# Patient Record
Sex: Female | Born: 1945 | Race: White | Hispanic: No | Smoking: Never smoker
Health system: Southern US, Community
[De-identification: ages and names within clinical notes are randomized; demographics above are authoritative.]

## PROBLEM LIST (undated history)

## (undated) DIAGNOSIS — K589 Irritable bowel syndrome without diarrhea: Secondary | ICD-10-CM

## (undated) DIAGNOSIS — M1990 Unspecified osteoarthritis, unspecified site: Secondary | ICD-10-CM

## (undated) DIAGNOSIS — F32A Depression, unspecified: Secondary | ICD-10-CM

## (undated) DIAGNOSIS — F419 Anxiety disorder, unspecified: Secondary | ICD-10-CM

## (undated) DIAGNOSIS — F329 Major depressive disorder, single episode, unspecified: Secondary | ICD-10-CM

## (undated) DIAGNOSIS — K219 Gastro-esophageal reflux disease without esophagitis: Secondary | ICD-10-CM

## (undated) HISTORY — DX: Irritable bowel syndrome, unspecified: K58.9

## (undated) HISTORY — PX: APPENDECTOMY: SHX54

## (undated) HISTORY — DX: Gastro-esophageal reflux disease without esophagitis: K21.9

## (undated) HISTORY — PX: RECTOCELE REPAIR: SHX761

## (undated) HISTORY — DX: Unspecified osteoarthritis, unspecified site: M19.90

---

## 1972-03-02 HISTORY — PX: CHOLECYSTECTOMY: SHX55

## 1982-03-02 HISTORY — PX: ABDOMINAL HYSTERECTOMY: SHX81

## 1998-04-26 ENCOUNTER — Ambulatory Visit: Admission: RE | Admit: 1998-04-26 | Discharge: 1998-04-26 | Payer: Self-pay | Admitting: Emergency Medicine

## 1998-11-06 ENCOUNTER — Ambulatory Visit (HOSPITAL_COMMUNITY): Admission: RE | Admit: 1998-11-06 | Discharge: 1998-11-06 | Payer: Self-pay | Admitting: Gastroenterology

## 1998-11-13 ENCOUNTER — Encounter (HOSPITAL_COMMUNITY): Admission: RE | Admit: 1998-11-13 | Discharge: 1999-02-11 | Payer: Self-pay | Admitting: Gastroenterology

## 1999-02-21 ENCOUNTER — Encounter: Admission: RE | Admit: 1999-02-21 | Discharge: 1999-02-21 | Payer: Self-pay | Admitting: Emergency Medicine

## 1999-02-21 ENCOUNTER — Encounter: Payer: Self-pay | Admitting: Emergency Medicine

## 2000-07-10 ENCOUNTER — Emergency Department (HOSPITAL_COMMUNITY): Admission: EM | Admit: 2000-07-10 | Discharge: 2000-07-10 | Payer: Self-pay | Admitting: *Deleted

## 2003-08-27 ENCOUNTER — Emergency Department (HOSPITAL_COMMUNITY): Admission: EM | Admit: 2003-08-27 | Discharge: 2003-08-28 | Payer: Self-pay | Admitting: Emergency Medicine

## 2004-02-19 ENCOUNTER — Emergency Department (HOSPITAL_COMMUNITY): Admission: EM | Admit: 2004-02-19 | Discharge: 2004-02-19 | Payer: Self-pay | Admitting: *Deleted

## 2004-10-08 ENCOUNTER — Emergency Department (HOSPITAL_COMMUNITY): Admission: EM | Admit: 2004-10-08 | Discharge: 2004-10-08 | Payer: Self-pay | Admitting: Emergency Medicine

## 2005-01-04 ENCOUNTER — Emergency Department (HOSPITAL_COMMUNITY): Admission: EM | Admit: 2005-01-04 | Discharge: 2005-01-04 | Payer: Self-pay | Admitting: Emergency Medicine

## 2005-05-14 ENCOUNTER — Ambulatory Visit (HOSPITAL_COMMUNITY): Admission: RE | Admit: 2005-05-14 | Discharge: 2005-05-14 | Payer: Self-pay | Admitting: Family Medicine

## 2005-06-03 ENCOUNTER — Ambulatory Visit (HOSPITAL_COMMUNITY): Admission: RE | Admit: 2005-06-03 | Discharge: 2005-06-03 | Payer: Self-pay | Admitting: Family Medicine

## 2005-06-03 LAB — CONVERTED CEMR LAB: Pap Smear: NORMAL

## 2005-06-08 ENCOUNTER — Emergency Department (HOSPITAL_COMMUNITY): Admission: EM | Admit: 2005-06-08 | Discharge: 2005-06-08 | Payer: Self-pay | Admitting: Emergency Medicine

## 2006-07-05 ENCOUNTER — Ambulatory Visit: Payer: Self-pay | Admitting: Psychiatry

## 2006-07-05 ENCOUNTER — Inpatient Hospital Stay (HOSPITAL_COMMUNITY): Admission: RE | Admit: 2006-07-05 | Discharge: 2006-07-08 | Payer: Self-pay | Admitting: Psychiatry

## 2006-11-01 IMAGING — CT CT PELVIS W/ CM
1 of 3 series · 14 of 32 positions shown, 19 images · IV contrast (omnipaque)
Comparison: none

CLINICAL DATA: Abdominal pain, nausea, and vomiting for two weeks. 
 ABDOMEN CT WITH CONTRAST:
TECHNIQUE: Multidetector CT imaging of the abdomen was performed following the standard protocol during bolus administration of intravenous contrast.
 Contrast:  100 cc Omnipaque 300.
 There is some scattered atelectasis in the bases bilaterally.  No pleural or pericardial effusion.  The patient has a small hiatal hernia.  The liver, spleen, adrenal glands, and kidneys all appear normal.  There is some fatty replacement of the pancreas, but no focal pancreatic abnormality.  The stomach and small bowel have a normal CT appearance.  No focal fluid collection or lymphadenopathy.  The patient is status post cholecystectomy.
TECHNIQUE: Multidetector CT imaging of the pelvis was performed following the standard protocol during bolus administration of intravenous contrast.
 The patient is status post hysterectomy and appendectomy.  Colon and urinary bladder appear normal.  No pelvic lymphadenopathy or fluid collections.  A few scattered diverticula are seen in the colon, but no diverticulitis.

[Series 6651: — · axial · 0.74mm/px · z∈[+1359,+1794]mm · 14 of 99 slices shown, 19 images]
[im 6/99  soft-tissue]
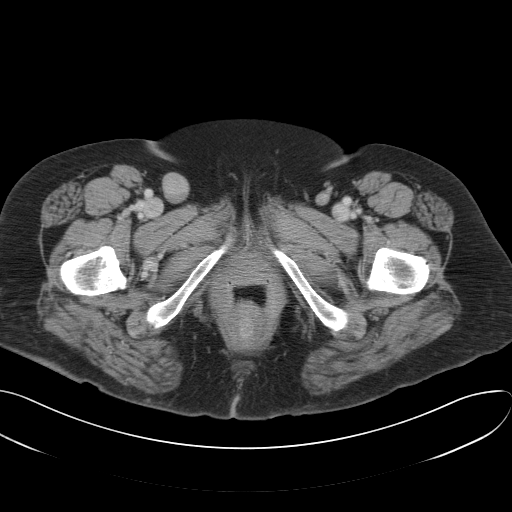
[im 6/99  bone]
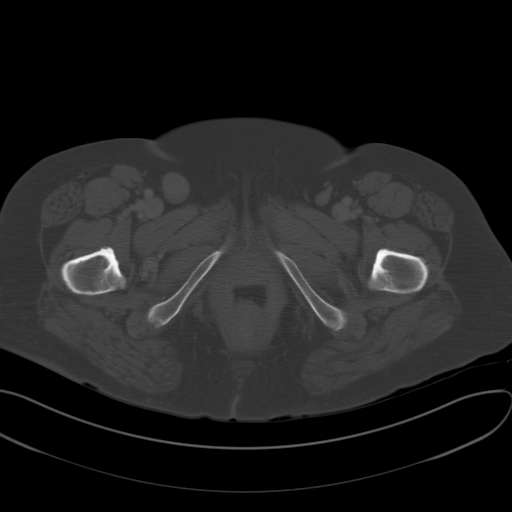
[im 12/99  soft-tissue]
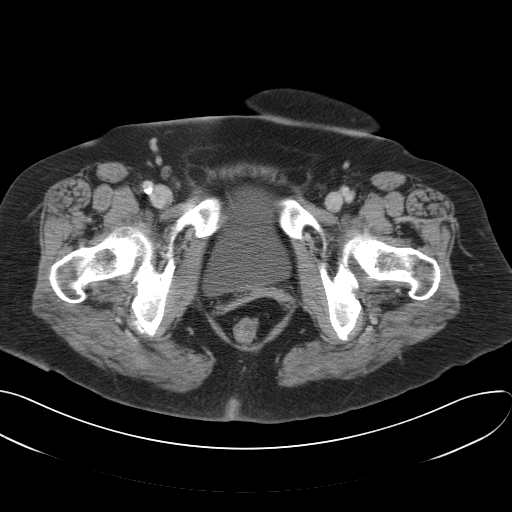
[im 24/99  soft-tissue]
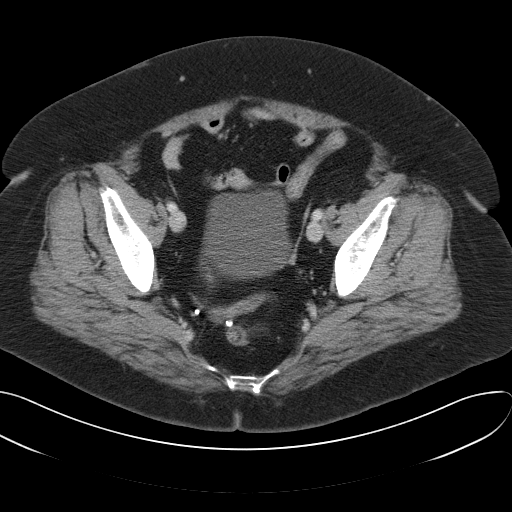
[im 29/99  soft-tissue]
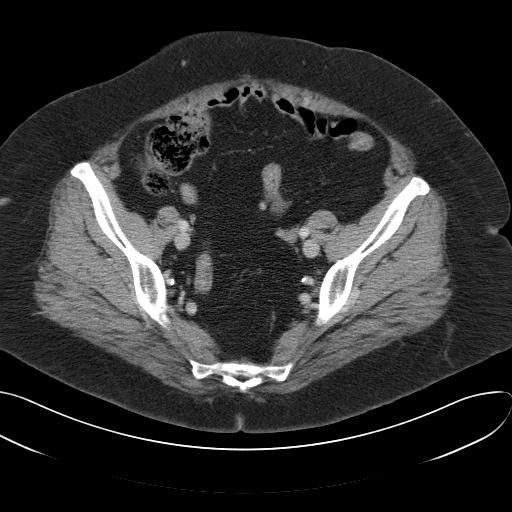
[im 35/99  soft-tissue]
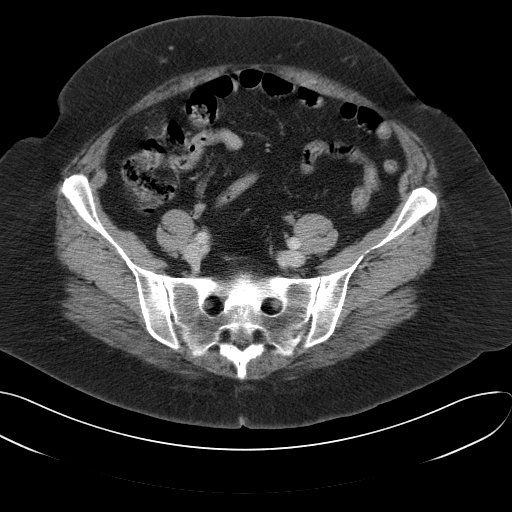
[im 41/99  soft-tissue]
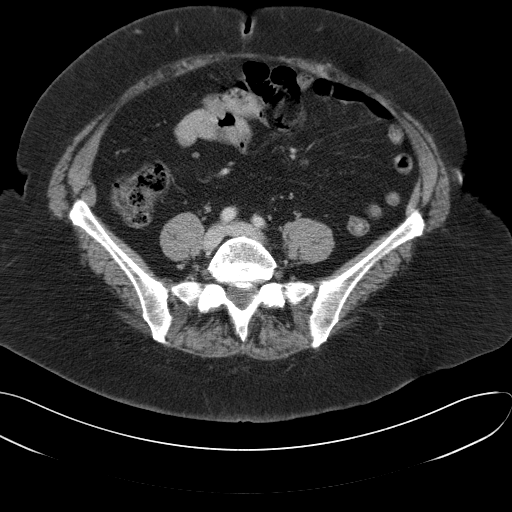
[im 52/99  soft-tissue]
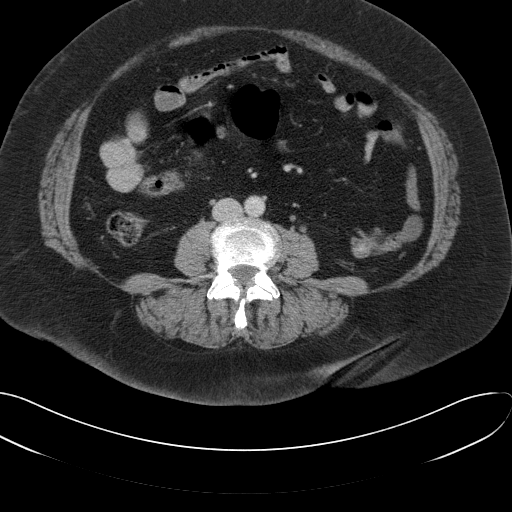
[im 58/99  soft-tissue]
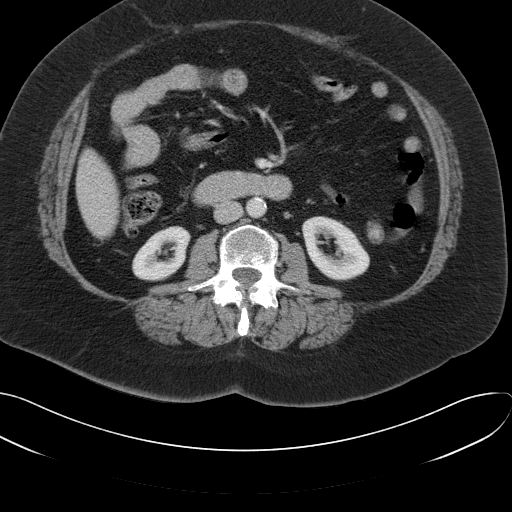
[im 64/99  soft-tissue]
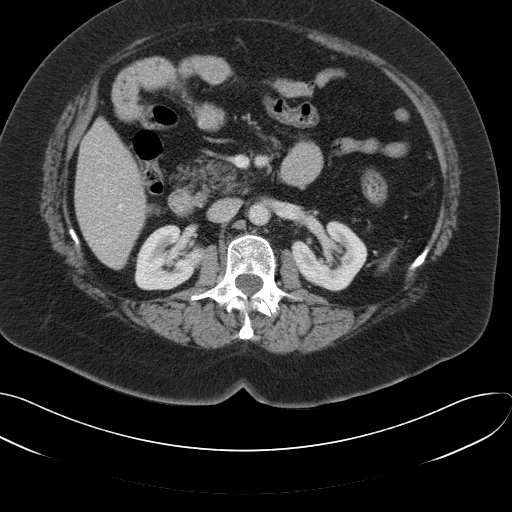
[im 64/99  bone]
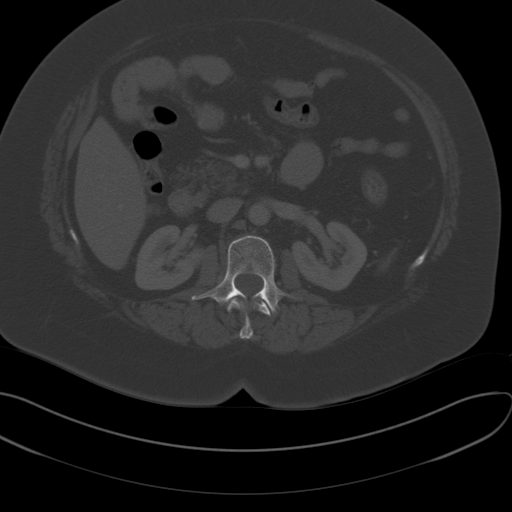
[im 70/99  soft-tissue]
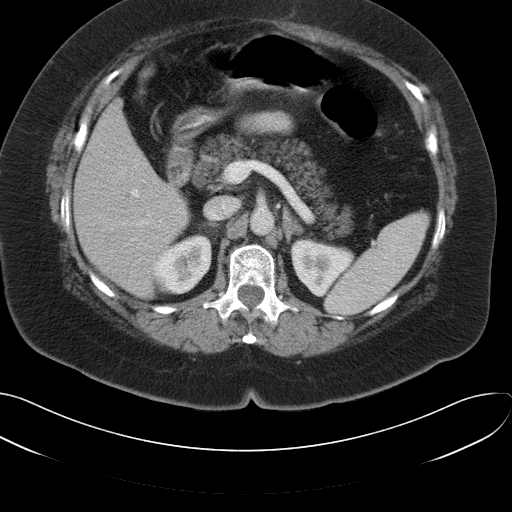
[im 75/99  soft-tissue]
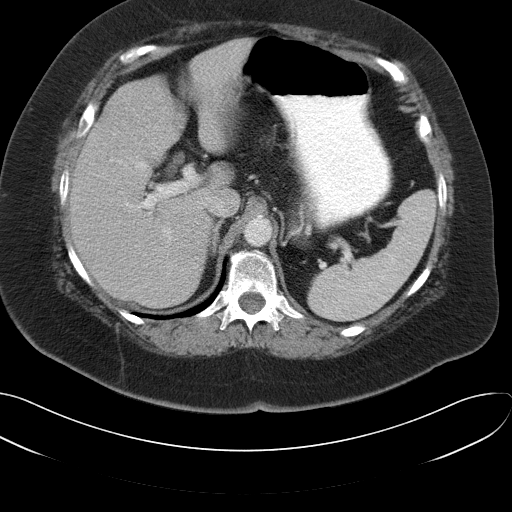
[im 75/99  lung]
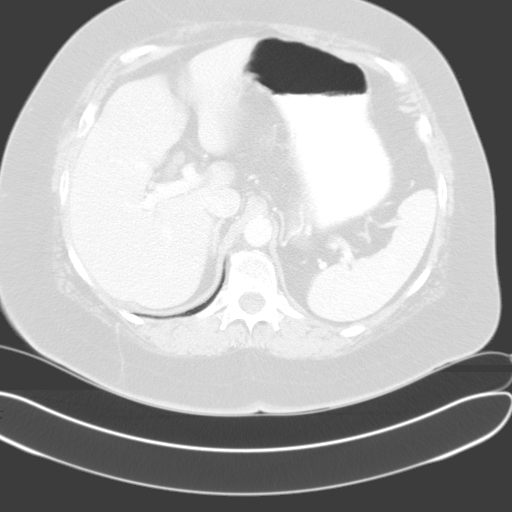
[im 81/99  lung]
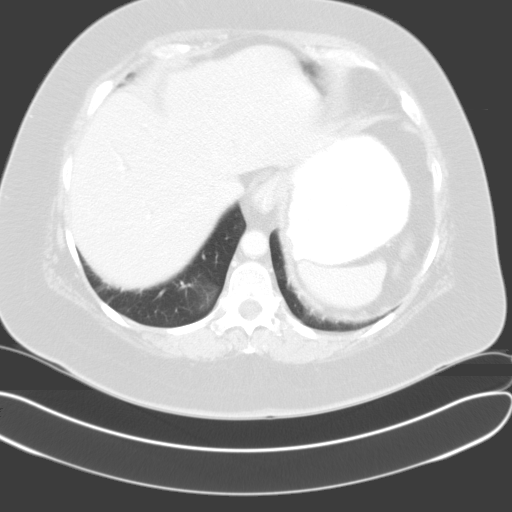
[im 87/99  soft-tissue]
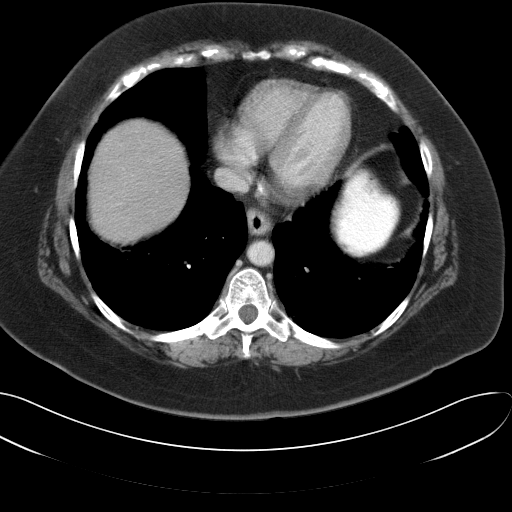
[im 87/99  lung]
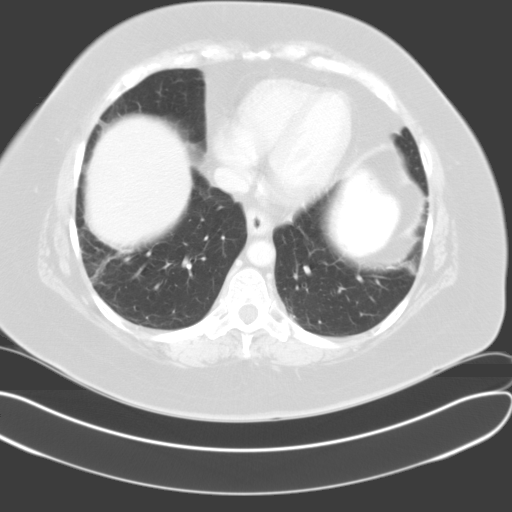
[im 93/99  soft-tissue]
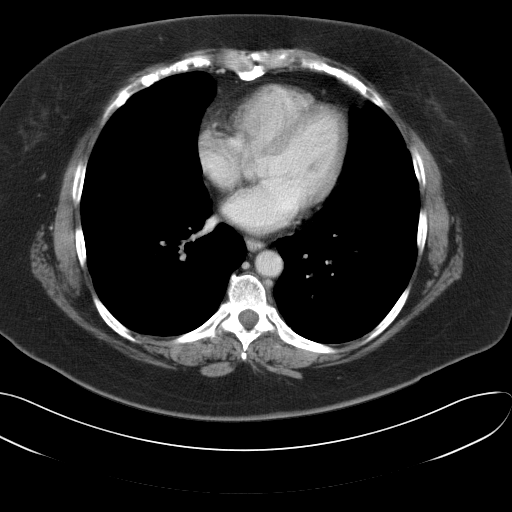
[im 93/99  lung]
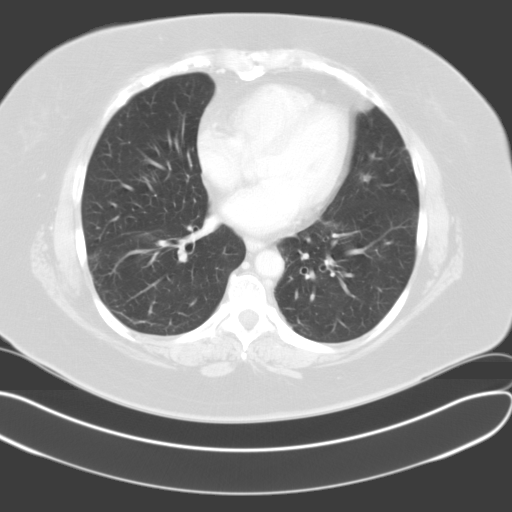

[14 of 32 positions shown; findings below may reference images not displayed]

IMPRESSION: 1.  No acute finding in the abdomen. 
 2.  Small hiatal hernia. 
 3. Status post cholecystectomy. 
 PELVIS CT WITH CONTRAST:
IMPRESSION: 1.  No acute finding.
 2.  Diverticulosis without diverticulitis.

## 2007-06-23 ENCOUNTER — Emergency Department (HOSPITAL_COMMUNITY): Admission: EM | Admit: 2007-06-23 | Discharge: 2007-06-23 | Payer: Self-pay | Admitting: Emergency Medicine

## 2007-06-29 ENCOUNTER — Ambulatory Visit: Payer: Self-pay | Admitting: Internal Medicine

## 2007-06-29 ENCOUNTER — Other Ambulatory Visit: Admission: RE | Admit: 2007-06-29 | Discharge: 2007-06-29 | Payer: Self-pay | Admitting: Internal Medicine

## 2007-06-29 ENCOUNTER — Encounter (INDEPENDENT_AMBULATORY_CARE_PROVIDER_SITE_OTHER): Payer: Self-pay | Admitting: Internal Medicine

## 2007-06-29 DIAGNOSIS — Z8669 Personal history of other diseases of the nervous system and sense organs: Secondary | ICD-10-CM | POA: Insufficient documentation

## 2007-06-29 DIAGNOSIS — I10 Essential (primary) hypertension: Secondary | ICD-10-CM | POA: Insufficient documentation

## 2007-06-29 DIAGNOSIS — F329 Major depressive disorder, single episode, unspecified: Secondary | ICD-10-CM

## 2007-06-29 DIAGNOSIS — N816 Rectocele: Secondary | ICD-10-CM | POA: Insufficient documentation

## 2007-06-29 DIAGNOSIS — M129 Arthropathy, unspecified: Secondary | ICD-10-CM | POA: Insufficient documentation

## 2007-06-29 DIAGNOSIS — F411 Generalized anxiety disorder: Secondary | ICD-10-CM | POA: Insufficient documentation

## 2007-06-29 DIAGNOSIS — M545 Low back pain: Secondary | ICD-10-CM

## 2007-06-29 DIAGNOSIS — D649 Anemia, unspecified: Secondary | ICD-10-CM

## 2007-06-29 DIAGNOSIS — R1032 Left lower quadrant pain: Secondary | ICD-10-CM

## 2007-06-29 DIAGNOSIS — K279 Peptic ulcer, site unspecified, unspecified as acute or chronic, without hemorrhage or perforation: Secondary | ICD-10-CM | POA: Insufficient documentation

## 2007-06-29 DIAGNOSIS — R0600 Dyspnea, unspecified: Secondary | ICD-10-CM

## 2007-06-29 DIAGNOSIS — K219 Gastro-esophageal reflux disease without esophagitis: Secondary | ICD-10-CM | POA: Insufficient documentation

## 2007-06-29 DIAGNOSIS — I8310 Varicose veins of unspecified lower extremity with inflammation: Secondary | ICD-10-CM | POA: Insufficient documentation

## 2007-06-29 LAB — CONVERTED CEMR LAB: OCCULT 1: NEGATIVE

## 2007-06-30 ENCOUNTER — Telehealth (INDEPENDENT_AMBULATORY_CARE_PROVIDER_SITE_OTHER): Payer: Self-pay | Admitting: *Deleted

## 2007-07-05 ENCOUNTER — Ambulatory Visit (HOSPITAL_COMMUNITY): Admission: RE | Admit: 2007-07-05 | Discharge: 2007-07-05 | Payer: Self-pay | Admitting: Internal Medicine

## 2007-07-07 ENCOUNTER — Encounter (INDEPENDENT_AMBULATORY_CARE_PROVIDER_SITE_OTHER): Payer: Self-pay | Admitting: Internal Medicine

## 2007-07-07 ENCOUNTER — Telehealth (INDEPENDENT_AMBULATORY_CARE_PROVIDER_SITE_OTHER): Payer: Self-pay | Admitting: *Deleted

## 2007-07-12 ENCOUNTER — Encounter (INDEPENDENT_AMBULATORY_CARE_PROVIDER_SITE_OTHER): Payer: Self-pay | Admitting: Internal Medicine

## 2007-07-20 ENCOUNTER — Encounter (INDEPENDENT_AMBULATORY_CARE_PROVIDER_SITE_OTHER): Payer: Self-pay | Admitting: Family Medicine

## 2007-07-22 ENCOUNTER — Ambulatory Visit: Payer: Self-pay | Admitting: Internal Medicine

## 2007-07-22 ENCOUNTER — Encounter (INDEPENDENT_AMBULATORY_CARE_PROVIDER_SITE_OTHER): Payer: Self-pay | Admitting: Internal Medicine

## 2007-07-27 ENCOUNTER — Ambulatory Visit: Payer: Self-pay | Admitting: Internal Medicine

## 2007-07-27 DIAGNOSIS — R413 Other amnesia: Secondary | ICD-10-CM | POA: Insufficient documentation

## 2007-07-28 ENCOUNTER — Ambulatory Visit (HOSPITAL_COMMUNITY): Admission: RE | Admit: 2007-07-28 | Discharge: 2007-07-28 | Payer: Self-pay | Admitting: Internal Medicine

## 2007-07-28 ENCOUNTER — Encounter (INDEPENDENT_AMBULATORY_CARE_PROVIDER_SITE_OTHER): Payer: Self-pay | Admitting: Internal Medicine

## 2007-08-03 ENCOUNTER — Encounter: Payer: Self-pay | Admitting: Internal Medicine

## 2007-08-03 ENCOUNTER — Ambulatory Visit (HOSPITAL_COMMUNITY): Admission: RE | Admit: 2007-08-03 | Discharge: 2007-08-03 | Payer: Self-pay | Admitting: Internal Medicine

## 2007-08-03 ENCOUNTER — Ambulatory Visit: Payer: Self-pay | Admitting: Internal Medicine

## 2007-08-03 ENCOUNTER — Encounter (INDEPENDENT_AMBULATORY_CARE_PROVIDER_SITE_OTHER): Payer: Self-pay | Admitting: Internal Medicine

## 2007-08-03 HISTORY — PX: COLONOSCOPY: SHX174

## 2007-08-03 HISTORY — PX: ESOPHAGOGASTRODUODENOSCOPY: SHX1529

## 2007-08-04 ENCOUNTER — Encounter (INDEPENDENT_AMBULATORY_CARE_PROVIDER_SITE_OTHER): Payer: Self-pay | Admitting: Internal Medicine

## 2007-08-16 ENCOUNTER — Ambulatory Visit (HOSPITAL_COMMUNITY): Payer: Self-pay | Admitting: Psychology

## 2007-08-16 ENCOUNTER — Telehealth (INDEPENDENT_AMBULATORY_CARE_PROVIDER_SITE_OTHER): Payer: Self-pay | Admitting: Internal Medicine

## 2007-08-19 ENCOUNTER — Encounter (INDEPENDENT_AMBULATORY_CARE_PROVIDER_SITE_OTHER): Payer: Self-pay | Admitting: Internal Medicine

## 2007-08-21 ENCOUNTER — Encounter (INDEPENDENT_AMBULATORY_CARE_PROVIDER_SITE_OTHER): Payer: Self-pay | Admitting: Family Medicine

## 2007-08-24 ENCOUNTER — Ambulatory Visit (HOSPITAL_COMMUNITY): Admission: RE | Admit: 2007-08-24 | Discharge: 2007-08-24 | Payer: Self-pay | Admitting: Internal Medicine

## 2007-08-30 ENCOUNTER — Encounter (INDEPENDENT_AMBULATORY_CARE_PROVIDER_SITE_OTHER): Payer: Self-pay | Admitting: Internal Medicine

## 2007-09-28 ENCOUNTER — Ambulatory Visit (HOSPITAL_COMMUNITY): Payer: Self-pay | Admitting: Psychology

## 2007-10-31 ENCOUNTER — Ambulatory Visit (HOSPITAL_COMMUNITY): Payer: Self-pay | Admitting: Psychology

## 2007-11-17 ENCOUNTER — Ambulatory Visit: Payer: Self-pay | Admitting: Internal Medicine

## 2007-11-30 ENCOUNTER — Ambulatory Visit (HOSPITAL_COMMUNITY): Payer: Self-pay | Admitting: Psychology

## 2007-12-07 ENCOUNTER — Ambulatory Visit: Payer: Self-pay | Admitting: Internal Medicine

## 2007-12-21 ENCOUNTER — Ambulatory Visit (HOSPITAL_COMMUNITY): Payer: Self-pay | Admitting: Psychology

## 2008-01-11 ENCOUNTER — Ambulatory Visit (HOSPITAL_COMMUNITY): Payer: Self-pay | Admitting: Psychology

## 2008-02-06 ENCOUNTER — Ambulatory Visit (HOSPITAL_COMMUNITY): Payer: Self-pay | Admitting: Psychology

## 2008-03-02 HISTORY — PX: PROLAPSED UTERINE FIBROID LIGATION: SHX5400

## 2008-04-09 ENCOUNTER — Encounter (INDEPENDENT_AMBULATORY_CARE_PROVIDER_SITE_OTHER): Payer: Self-pay | Admitting: Internal Medicine

## 2008-10-04 ENCOUNTER — Encounter: Payer: Self-pay | Admitting: Gastroenterology

## 2008-10-08 ENCOUNTER — Encounter: Payer: Self-pay | Admitting: Gastroenterology

## 2008-10-11 ENCOUNTER — Encounter: Payer: Self-pay | Admitting: Gastroenterology

## 2009-01-09 DIAGNOSIS — K5730 Diverticulosis of large intestine without perforation or abscess without bleeding: Secondary | ICD-10-CM | POA: Insufficient documentation

## 2009-01-09 DIAGNOSIS — Z8711 Personal history of peptic ulcer disease: Secondary | ICD-10-CM | POA: Insufficient documentation

## 2009-01-09 DIAGNOSIS — K5909 Other constipation: Secondary | ICD-10-CM | POA: Insufficient documentation

## 2009-01-09 DIAGNOSIS — D126 Benign neoplasm of colon, unspecified: Secondary | ICD-10-CM | POA: Insufficient documentation

## 2009-06-12 ENCOUNTER — Inpatient Hospital Stay (HOSPITAL_COMMUNITY): Admission: RE | Admit: 2009-06-12 | Discharge: 2009-06-13 | Payer: Self-pay | Admitting: Obstetrics & Gynecology

## 2010-03-23 ENCOUNTER — Encounter: Payer: Self-pay | Admitting: Internal Medicine

## 2010-05-21 LAB — DIFFERENTIAL
Basophils Absolute: 0 10*3/uL (ref 0.0–0.1)
Basophils Relative: 1 % (ref 0–1)
Eosinophils Absolute: 0.1 10*3/uL (ref 0.0–0.7)
Eosinophils Absolute: 0.1 10*3/uL (ref 0.0–0.7)
Lymphocytes Relative: 34 % (ref 12–46)
Monocytes Absolute: 0.4 10*3/uL (ref 0.1–1.0)
Monocytes Relative: 9 % (ref 3–12)
Neutrophils Relative %: 53 % (ref 43–77)
Neutrophils Relative %: 79 % — ABNORMAL HIGH (ref 43–77)

## 2010-05-21 LAB — CBC
HCT: 34.1 % — ABNORMAL LOW (ref 36.0–46.0)
HCT: 40.8 % (ref 36.0–46.0)
Hemoglobin: 12 g/dL (ref 12.0–15.0)
MCV: 90.7 fL (ref 78.0–100.0)
Platelets: 120 10*3/uL — ABNORMAL LOW (ref 150–400)
Platelets: 90 10*3/uL — ABNORMAL LOW (ref 150–400)
RBC: 4.49 MIL/uL (ref 3.87–5.11)
RDW: 12.4 % (ref 11.5–15.5)
RDW: 13 % (ref 11.5–15.5)
WBC: 4.3 10*3/uL (ref 4.0–10.5)
WBC: 6.7 10*3/uL (ref 4.0–10.5)

## 2010-05-21 LAB — URINALYSIS, DIPSTICK ONLY: Ketones, ur: NEGATIVE mg/dL

## 2010-05-21 LAB — TYPE AND SCREEN: ABO/RH(D): O POS

## 2010-05-21 LAB — COMPREHENSIVE METABOLIC PANEL
Chloride: 99 mEq/L (ref 96–112)
GFR calc Af Amer: >60 mL/min (ref >60)
GFR calc non Af Amer: >60 mL/min (ref >60)

## 2010-07-15 NOTE — Assessment & Plan Note (Signed)
NAMEMarland Kitchen  CATIA, TODOROV                CHART#:  [id number]   DATE:  11/17/2007                       DOB:  February 21, 1946   CHIEF COMPLAINT:  Followup gastroesophageal reflux disease/chronic  constipation.   PROBLEM LIST:  1. Erosive/ulcerative reflux esophagitis, short benign tubular peptic      strictures status post dilatation by Dr. Jena Gauss on August 03, 2007,      with a 54-French [facility] dilator.  2. Diverticulosis.  3. Hyperplastic colon polyps.  4. Chronic constipation.  5. History of rectocele.  6. History of bleeding peptic ulcer disease with anemia requiring      transfusion in 2000.  7. Status post cholecystectomy in 1977.  8. Status post hysterectomy in 1984.   SUBJECTIVE:  The patient is a 65 year old Caucasian female with history  of erosive/ulcerative esophagitis.  She has been on b.i.d. omeprazole  since her EGD as above.  She is doing remarkably well.  She has also  intentionally tried to lose weight.  She has lost about 12 pounds and  feels very good.  She is walking everyday.  She is getting ready to have  surgery on her varicose veins.  She has started Activia yogurt, and she  does occasionally still have some constipation.  She has been taking  MiraLax 17 g on a p.r.n. basis, but has not been taking on a daily  basis.  She says that it does seem to help, but she is afraid to take it  everyday and she is worried about her activities of daily living being  interfered with.  She is generally taking at night before she goes to  bed.   CURRENT MEDICATIONS:  See the list from November 17, 2007.   ALLERGIES:  Sulfa.   OBJECTIVE:  VITAL SIGNS:  Weight 210 pounds, height 6 feet and 4 inches,  temperature is 97.8, blood pressure 120/78, pulse 60.  GENERAL:  She is a well-developed, well-nourished, obese Caucasian  female, who is alert, oriented, pleasant, and cooperative in no acute  distress.  HEENT:  Sclerae clear, nonicteric.  Conjunctivae pink.  Oropharynx pink  and  moist without any lesions.  CHEST:  Heart, regular rate and rhythm.  Normal S1 and S2.  ABDOMEN:  Protuberant with positive bowel sounds x4.  No bruits  auscultated.  Soft, nontender, nondistended without palpable mass or  hepatosplenomegaly.  No rebound, tenderness, or guarding.  EXTREMITIES:  Without clubbing or edema.   ASSESSMENT:  The patient is a 65 year old Caucasian female with  complicated gastroesophageal reflux disease/erosive/ulcerative  esophagitis, doing well on b.i.d. proton pump inhibitor.   Diverticulosis.   Chronic constipation.   PLAN:  1. Continue omeprazole 20 mg b.i.d., #62 with 11 refills.  2. High-fiber diet and fiber supplement of choice.  I have suggested      Benefiber or FiberChoice.  3. She should increase her MiraLax to 17 g daily unless she develops      diarrhea to help with her chronic constipation.  4. Office visit in 1 year with Dr. Jena Gauss or sooner if she has any      problems.   VW:[id number]       Lorenza Burton, N.P.  Electronically Signed     R. Roetta Sessions, M.D.  Electronically Signed    KJ/MEDQ  D:  11/17/2007  T:  11/18/2007  Job:  [id number]

## 2010-07-15 NOTE — Op Note (Signed)
NAMENICHOLE, NEYER               ACCOUNT NO.:  [id number]   MEDICAL RECORD NO.:  [id number]          PATIENT TYPE:  AMB   LOCATION:  DAY                           FACILITY:  [facility]   PHYSICIAN:  R. Roetta Sessions, M.D. DATE OF BIRTH:  [birth year]   DATE OF PROCEDURE:  08/03/2007  DATE OF DISCHARGE:                               OPERATIVE REPORT   INDICATIONS FOR PROCEDURE:  A 65 year old lady with refractory  gastroesophageal reflux disease symptoms and esophageal dysphagia to  solids, also some intermittent left lower quadrant abdominal pain and  diverticulosis on CT, but nothing else.  She has never had her lower GI  tract evaluated.  There is no family history of colorectal neoplasia,  EGD with possible esophageal dilation followed by colonoscopy now being  done.  The risks, benefits, alternatives, and limitations have been  reviewed, all parties' questions were answered, and all parties  agreeable.  Please see documentation in the medical record.   PROCEDURE NOTE:  O2 saturation, blood pressure, pulse, and respirations  were monitored throughout the entire procedure.   CONSCIOUS SEDATION:  Versed 6 mg IV and Demerol 125 mg IV in divided  doses.  Cetacaine spray for topical pharyngeal anesthesia.   INSTRUMENT:  Pentax video chip system.   FINDINGS:  EGD examination of tubular esophagus revealed short tubular  stricture at the EG junction and distal esophageal ulcers with bleeding  with trailing erosions coming up at 4 cm above the EG junction.  There  was no Barrett esophagus or evidence of neoplasia.  Please see multiple  photographs.  EG junction was easily traversed with scope.  Stomach:  Gas cavity was emptied and insufflated well with air.  A thorough  examination of the gastric mucosa including retroflexed view of the  proximal stomach and esophagogastric junction demonstrated deformity of  the antrum and hiatal hernia.  The gastric mucosa otherwise appeared  normal.   Pylorus was patent and easily traversed.  The examination of  the bulb, second portion revealed no abnormalities.  Therapeutic/diagnostic maneuvers were performed.  The scope was  withdrawn.  A 65-French [facility] dilator was passed in full insertion,  look back revealed no apparent complication related to the passage of  the dilator.  The patient tolerated the procedure well and was prepared  for colonoscopy.  Digital rectal exam revealed no abnormalities.   ENDOSCOPIC FINDINGS:  The prep was adequate.  Colon:  Colonic mucosa was  surveyed from the rectosigmoid junction through the left transverse,  right colon, to the appendiceal orifice, ileocecal valve, and cecum.  These structures well seen and photographed for the record.  Terminal  ileum was intubated to 10 cm from this level.  The scope was slowly  cautiously withdrawn.  All previously mentioned mucosal surfaces were  again seen.  The patient noted to have a tortuous redundant colon, which  required a number of maneuvers including external abdominal pressure and  changing the patient's position to reach the cecum.  The patient was  noted to have extensive left-sided and transverse diverticula, colonic  mucosa and terminal ileal mucosa appeared normal.  Scope was pulled down  to the rectum.  Rectal vault was actually small and I was unable to  retroflex, but for the same reason I was not able to see the distal  rectum very well on fos.  The patient had 2 diminutive polyps 3 cm from  the anal verge, both of which were cold biopsied/removed.  Remainder of  the rectal mucosa appeared normal.  The patient tolerated the procedure  well and was reactive in endoscopy.   IMPRESSION:  Esophagogastroduodenoscopy, erosive/ulcerative reflux  esophagitis, short benign tubular peptic stricture status post dilation  as described above, otherwise unremarkable esophagus.  Hiatal hernia  deformity of the antrum, otherwise normal gastric mucosa,  normal D1 and  D2.  Colonoscopy findings, diminutive distal rectal polyps (2), status  post cold biopsy removal.  Remainder of rectal mucosa appeared normal.  Left-sided transverse diverticula.  Remainder of colonic mucosa and  terminal ileal mucosa appeared normal.   RECOMMENDATIONS:  1. Increase Prilosec 20 mg orally twice daily before breakfast and      supper.  Daily Metamucil consists of fiber supplement with her      reflux and diverticulosis provided to Ms. Carusone.  2. Continue GlycoLax 17 g orally at bedtime p.r.n. constipation.  3. Follow up on path.  4. Further recommendations to follow.      Jonathon Bellows, M.D.  Electronically Signed     RMR/MEDQ  D:  08/03/2007  T:  08/03/2007  Job:  [id number]   cc:   Erle Crocker, M.D.

## 2010-07-15 NOTE — Consult Note (Signed)
NAMEDEEYA, RICHESON               ACCOUNT NO.:  [id number]   MEDICAL RECORD NO.:  [id number]          PATIENT TYPE:  AMB   LOCATION:  DAY                           FACILITY:  [facility]   PHYSICIAN:  R. Roetta Sessions, M.D. DATE OF BIRTH:  11-Jan-1946   DATE OF CONSULTATION:  07/22/2007  DATE OF DISCHARGE:                                 CONSULTATION   REFERRING PHYSICIAN:  Erle Crocker, M.D.   REASON FOR CONSULTATION:  Left lower quadrant abdominal pain.   HISTORY OF PRESENT ILLNESS:  Ms. Kaplan is a 65 year old Caucasian  female.  She has had a one month history of intermittent left lower  quadrant abdominal pain.  It usually begins first thing in the morning  when she awakens and after she urinates.  She describes the pain as a  left lower quadrant stabbing type pain.  It is 10/10 on pain scale at  worst.  It has brought her to tears at times.  She denies any fever or  chills.  She does have nausea with the pain.  Denies any vomiting.  Movement seems to make the pain worse  She does have chronic  constipation.  She can go three to four days without a bowel movement.  She has tried Dulcolax, which does seem to help some, but she has a  sensation of incomplete evacuation.  She denies any rectal bleeding or  melena.  She did have a CT scan of the abdomen and pelvis with contrast  on June 23, 2007.  She was found to have diffuse fatty liver and  diverticulosis without evidence of diverticulitis.   She does have heartburn daily as well as indigestion.  She does have  some dysphagia and feels that her food gets stuck mid sternally.  She  has problems with both solids and liquids.  Denies any odynophagia.  Her  appetite has been good and her weight has been stable.   She had a urinalysis, which showed trace leucocytes.  On June 23, 2007,  she had a MET 7, which was normal and she was found to have  thrombocytopenia with a platelet count of 136.  She had a normal  hemoglobin of 13.9 and  a hematocrit of 39.7, and white blood cell at 4.6  during emergency room visit.  She was treated with Cipro and Flagyl for  suspected diverticulitis.   PAST MEDICAL/SURGICAL HISTORY:  She has history of varicose veins, which  she is planing on having stripped soon.  She has history of rectocele,  depression, which resulted in disability, bleeding peptic ulcer disease  with anemia requiring transfusion in 2000.  She has chronic GERD.  She  had a cholecystectomy in 1977 and a partial hysterectomy in 1984.   CURRENT MEDICATIONS:  1. Effexor XR 150 mg daily.  2. Xanax 1 mg t.i.d.  3. Trazodone, unknown dose nightly.  4. Motrin 200 mg daily.  5. Dulcolax p.r.n.  6. Over-the-counter acid reducer p.r.n.   ALLERGIES:  SULFA.   FAMILY HISTORY:  There is no known family history of colorectal  carcinoma, liver or chronic GI  problems.  Mother deceased at 39  secondary to CVA.  Father decreased at 36 secondary to lung cancer.  She  has one healthy brother.   SOCIAL HISTORY:  Ms. Waldo is married.  She has four healthy children.  She is disabled.  Prior to that, she worked in a warehouse.  She denies  any tobacco or drug use.  She did have heavy alcohol use for over three  years, but quit drinking approximately 25 years ago.   REVIEW OF SYSTEMS:  See HPI.  Otherwise negative review of systems.   PHYSICAL EXAMINATION:  VITAL SIGNS:  Weight 216 pounds.  Height 64  inches.  Temperature 97.7.  Blood pressure 110/64 and pulse 60.  GENERAL:  Ms. Manter is a 65 year old Caucasian female who is alert,  oriented, pleasant and cooperative, in no acute distress.  HEENT:  Sclerae are clear.  Nonicteric.  Conjunctivae are pink.  Oropharynx pink and moist.  She has poor dentition.  NECK:  Supple without any masses, thyromegaly.  CHEST:  Heart regular rate and rhythm.  Normal S1, S2 without murmurs,  clicks, rubs or gallops.  LUNGS:  Clear to auscultation bilaterally.  ABDOMEN:  Protuberant with  positive sounds x4.  No bruits auscultated.  Soft, nondistended.  She has mild tenderness in left lower quadrant on  deep palpation.  There is no rebound, tenderness or guarding.  No  hepatosplenomegaly or mass.  Exam limited given patient's body habitus.  EXTREMITIES:  Without clubbing or edema bilaterally.   IMPRESSION:  1. Ms. Criss is a 65 year old female with a one month history of      intermittent left lower quadrant abdominal pain.  CT with evidence      of diverticulosis, but no evidence of diverticulitis.  It is      possible she could have had a low grade diverticulitis not picked      up on CT, and has completed antibiotic therapy for this.  She also      has chronic constipation, which could be contributing to her      abdominal pain.  We will go ahead and begin treatment for that      today.  2. She was found to have fatty liver on CT as well as      thrombocytopenia.  It is possible she could have chronic liver      disease, and we will investigate further regarding history of heavy      alcohol use and potential for non-alcoholic steatohepatitis as      well.  3. She has chronic gastroesophageal reflux disease, has not been on      proton pump inhibitor, and has dysphagia to both solids and      liquids, which is going to require further evaluation to rule out      complicated gastroesophageal reflux disease and the development of      web ring or stricture.   PLAN:  1. Diverticulosis.  Literature given for her review.  2. Constipation.  Literature given for her review.  3. LFTs and  INR today.  4. Colonoscopy and esophagogastroduodenoscopy with possible esophageal      dilatation with Dr. Jena Gauss in the near future.  Discussed the      procedure including risks and benefits including, but not limited      to, infection, perforation, drug reaction.  She agrees with the      plan.  Consent will be obtained.  5. GlycoLax 17  grams daily as needed as directed for  constipation, #5,      27 grams with 5 refills.  6. Omeprazole 20 mg daily #31 with 5 refills.  7. Further recommendations pending procedures.   Thank you, Dr. Jen Mow, for allowing [place] to participate in the care of Ms.  Emmanuel.      Lorenza Burton, N.P.      Jonathon Bellows, M.D.  Electronically Signed    KJ/MEDQ  D:  07/22/2007  T:  07/22/2007  Job:  [id number]   cc:   Erle Crocker, M.D.

## 2010-07-18 NOTE — Procedures (Signed)
NAMEJENITA, Elaine Alvarez               ACCOUNT NO.:  [id number]   MEDICAL RECORD NO.:  [id number]          PATIENT TYPE:  OUT   LOCATION:  RESP                          FACILITY:  [facility]   PHYSICIAN:  Kofi A. Gerilyn Pilgrim, M.D. DATE OF BIRTH:  1945-12-02   DATE OF PROCEDURE:  DATE OF DISCHARGE:  08/24/2007                              EEG INTERPRETATION   INDICATION:  This is a 65 year old lady who presents with spells  suspicious for seizures.   MEDICATIONS:  Effexor, Xanax, trazodone, Prilosec, and Motrin.   ANALYSIS:  A 16-channel recording using standard 10/20 systems is  conducted for 26 minutes.  There is a well-formed posterior rhythm of 10-  1/2 Hz, which attenuates with eye opening.  There is beta activity  observed in the frontal areas.  The sleep and drowsy activities are  recorded.  Photic stimulation and hyperventilation are carried out  without significant changes in the background activity.  There is no  focal or lateralized slowing.  There is no epileptiform activity  observed.   IMPRESSION:  This is a normal recording of the awake and drowsy state.  A single recording does not rule out epileptic seizures, if clinically  indicated, a prolonged recording may be useful.      Kofi A. Gerilyn Pilgrim, M.D.  Electronically Signed     KAD/MEDQ  D:  08/25/2007  T:  08/26/2007  Job:  [id number]

## 2010-07-18 NOTE — Discharge Summary (Signed)
NAMEJENINA, Elaine Alvarez               ACCOUNT NO.:  [id number]   MEDICAL RECORD NO.:  [id number]          PATIENT TYPE:  IPS   LOCATION:  0302                          FACILITY:  [facility]   PHYSICIAN:  Anselm Jungling, MD  DATE OF BIRTH:  10-Oct-1945   DATE OF ADMISSION:  07/05/2006  DATE OF DISCHARGE:  07/08/2006                               DISCHARGE SUMMARY   IDENTIFYING DATA AND REASON FOR ADMISSION:  This was an inpatient  psychiatric admission for Elaine Alvarez, a 65 year old woman from [place], [place].  She was referred by [facility], but apparently had  not had any recent treatment there.  She had presented at the crisis  center with uncontrollable crying.  Her husband had been ill for the  last 6 years, and had been going downhill due to his various medical  conditions.  There had also been financial stressors.  She stated that  she had been depressed for years.  No energy to do anything.  She  had been isolative, and had few supports, aside from her husband and her  19 year old daughter living at home.  She had been having increasing  suicidal ideation over a period of 3 months, but had been talking  myself out of it.  Nonetheless, she feared one day she would yield to  those impulses to attempt suicide.  She had never done so in the past.  Please refer to the admission note for further details pertaining to the  symptoms, circumstances and history that led to her hospitalization.  She was given an initial Axis I diagnosis of major depressive disorder,  recurrent.   MEDICAL AND LABORATORY:  The patient was medically and physically  assessed by the psychiatric nurse practitioner.  She used Advair as  needed for breathing difficulties, but otherwise was in good health.  She was medically and physically assessed by the psychiatric nurse  practitioner.   HOSPITAL COURSE:  The patient was admitted to the adult inpatient  psychiatric service.  She presented as a  well-nourished, well-developed  woman who was alert and fully oriented, pleasant but sad.  She was a  good historian.  She was quite tearful, with depressed mood.  There were  no signs or symptoms of psychosis or thought disorder.  She denied any  suicidal ideation, intent or plan.  She verbalized a strong desire for  help.  She denied drug and alcohol issues.   She reported she had had a trial of Lexapro, up to a dose of 40 mg,  during 2007, but it had not helped.  She stated that she quit Lexapro  approximately 1 month prior to admission, and began a trial of BuSpar,  which did not appear helpful.  She also had had passed trials of  Effexor, via Dr. Nolen Mu, several years ago.  She indicated that she  had a reasonably good response to that medication at that time.  Because  of this, a retrial of Effexor XR was undertaken, beginning at a dose of  37.5 mg daily.   She participated in various therapeutic groups and activities,  and was a  good participant in the treatment program.  She consented to a family  meeting on the third hospital day that involved her husband.  The  purpose of the session was to discuss plans and supports in preparation  for discharge.  Husband was very supportive, but there was some opinion  expressed by the patient herself that she was concerned that her husband  and daughter did not understand her depression.  Husband admitted to  having limited knowledge of depression.  They reviewed signs and  symptoms of depression, methods of treatment and the need for coping  skills.   They further discussed discharge and aftercare plans.  Following this  meeting, the patient was discharged home, and she agreed to the  following aftercare plan.   AFTERCARE:  The patient was to follow up at [facility] with an appointment on Jul 21, 2006.   DISCHARGE MEDICATIONS:  1. Effexor XR 37.5 mg daily, to be increased to 75 mg daily on Jul 15, 2006.   2. Trazodone 100 mg q.h.s. as needed for sleep.  3. Advair as needed for breathing difficulties.   DISCHARGE DIAGNOSES:  AXIS I: Major depressive disorder, recurrent.  AXIS II: Deferred.  AXIS III: History of asthma.  AXIS IV: Stressors severe.  AXIS V: GAF on discharge 65.      Anselm Jungling, MD  Electronically Signed     SPB/MEDQ  D:  07/21/2006  T:  07/21/2006  Job:  [id number]

## 2010-07-18 NOTE — H&P (Signed)
Elaine Alvarez, Elaine Alvarez               ACCOUNT NO.:  [id number]   MEDICAL RECORD NO.:  [id number]          PATIENT TYPE:  IPS   LOCATION:  0302                          FACILITY:  [facility]   PHYSICIAN:  Anselm Jungling, MD  DATE OF BIRTH:  03-05-1945   DATE OF ADMISSION:  07/05/2006  DATE OF DISCHARGE:                       PSYCHIATRIC ADMISSION ASSESSMENT   IDENTIFYING INFORMATION:  This is a 65 year old married white female.  This is a voluntary admission.   HISTORY OF PRESENT ILLNESS:  This 65 year old female with a history of  depression presents with a 4 to 6-week onset of more severe symptoms  against a background of rather chronic depression.  She has been off her  depression medications for about a year, which last was Lexapro, after  she found she was unable to afford any of these things after losing  insurance.  She reports a 4-week history of broken sleep, sleeping only  2-3 hours at a time.  Initially able to fall asleep and then waking in  the middle of the night wandering around, unable to get back to sleep.  Severe anhedonia with decreased concentration.  Unable to enjoy any of  her usual activities such as reading.  In the past 2 weeks she has  developed daily crying spells and finds herself staring in the middle of  the night at multiple medication bottles of her husband's, thinking of  overdosing on his multiple medications.  She also reports that her  appetite is variable for the past 2 weeks; sometimes eating everything  in sight, and then going for days with very poor appetite.  Denies fever  or chills, or other acute somatic symptoms.  Suicidal thoughts daily now  for the past 5 days.  She presented at the local mental health tearful,  feeling that she could not go on.  She denies any homicidal thoughts or  hallucinations.   PAST PSYCHIATRIC HISTORY:  Patient has no current care.  She was treated  in the past for depression by Dr. Emerson Monte, and reports  positive  response to Effexor in the past, and then last did take Lexapro 20 mg,  which she took about a year ago and stopped.  She felt that she has done  the best on Effexor.  Has also taken BuSpar which gave her no response,  and Xanax for anxiety from time-to-time.  She denies any history of  prior suicide attempts.  No history of prior hospitalizations.  This is  her first inpatient psychiatric admission.   SOCIAL HISTORY:  Married white female.  She reports significant  caregiver stress in caring for her husband who has been chronically ill  since 2003 or so with chronic severe pancreatic disease with multiple  surgeries and chronic pain.  Endorses a lot of financial stress, barely  making it on their income.  One child is an adult daughter living in the  home, and all 3 individuals in the home are living on very limited  income.  She denies any current or past substance abuse and no legal  problems.   FAMILY HISTORY:  Noncontributory.  MEDICAL PROBLEMS:  The patient has a history of GERD and joint pain,  probably osteoarthritis.  She is followed for primary care at [facility] [place]  The  patient does have a history of asthma.   MEDICAL HISTORY:  Remarkable for history of cholecystectomy in 1976,  hysterectomy in 1984, and has a history of tattoos on her bilateral  deltoids which have been removed surgically.  No history of seizures,  blackouts, memory loss or brain injury under current medical problems.   CURRENT MEDICATIONS:  Advair discus 1 puff b.i.d., dose unknown.  Albuterol 2 puffs as needed for her asthma.  She has taken some BuSpar  in the past, 10 mg b.i.d., and Lexapro, not in a while.   DRUG ALLERGIES:  SULFA.   PHYSICAL EXAM:  Well-nourished, well-developed female who is in no acute  distress.  Her full physical exam is noted in the record along with the  review of systems which is essentially unremarkable  except for items  stated above.  She is 5 feet 3 inches tall, 207 pounds, temperature  96.6, pulse 63, respirations 16, blood pressure 146/80.  She is in no  distress.   Diagnostic studies were done here at the hospital.  CBC:  WBC 4.3,  hemoglobin 13.4, hematocrit 39.3 and platelets 152,000.  Chemistries:  sodium 135, potassium 4.3, chloride 101, carbon dioxide 26, BUN 9,  creatinine 0.93 and random glucose 103.  Liver enzymes:  SGOT 15, SGPT  19, alkaline phosphatase 80 and total bilirubin 0.9.  Her calcium is  normal at 9.3 and TSH within normal limits at 1.133.  UA and urine drug  screen are currently pending.   MENTAL STATUS EXAM:  Fully alert female, pleasant, cooperative,  overweight, blunted affect, and is readily tearful as she describes the  stressors on her family.  Speech is normal in pace, tone and amount,  fluent, relevant and articulate.  Mood is depressed, somewhat helpless.  Thought process logical, coherent.  No signs of psychosis.  No flight of  ideas, paranoia or guarding.  Positive suicidal thoughts with plan to  overdose and access to means.  Cognition well-preserved.  Well-oriented  to person, place and situation.  Concentration is very mildly decreased,  but generally intact.  Impulse control and judgment within normal  limits.  Insight adequate and intellect average, and remote and recent  memory are intact.   AXIS I:  Major depression, recurrent, severe.  No psychosis.  AXIS II:  Deferred.  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  AXIS IV:  Severe issues with caregiver and financial stressors.  AXIS V:  Current 38, past year 44.   PLAN:  Voluntarily admit the patient with q.15-minute checks in place.  We have discussed with her the risks and benefits of the different  antidepressants and did talk with her about going back on the Effexor XR 37.5 mg and titrating up from there.  She feels she got good  benefit  from that, and is in agreement with that plan.  We are also going to  discontinue Lexapro and BuSpar which she already stopped on her own,  will not go back to that at this time, and will start her on trazodone  50 mg h.s. p.r.n. for insomnia, and will continue her albuterol and  Advair, her routine medications, and plan on referring her both for  counseling and to [facility]  for followup counseling,  and we will think about having her husband in for a family session if  she is in agreement with that.  Estimated length of stay is 5 days.      Margaret A. Lorin Picket, N.P.      Anselm Jungling, MD  Electronically Signed    MAS/MEDQ  D:  07/06/2006  T:  07/06/2006  Job:  [id number]

## 2010-11-12 ENCOUNTER — Inpatient Hospital Stay (HOSPITAL_COMMUNITY)
Admission: AD | Admit: 2010-11-12 | Discharge: 2010-11-14 | DRG: 101 | Disposition: A | Discharge status: Home / Self Care | Payer: Medicare Other | Source: Other Acute Inpatient Hospital | Attending: Internal Medicine | Admitting: Internal Medicine

## 2010-11-12 ENCOUNTER — Other Ambulatory Visit: Payer: Self-pay

## 2010-11-12 ENCOUNTER — Emergency Department (HOSPITAL_COMMUNITY)
Admission: EM | Admit: 2010-11-12 | Discharge: 2010-11-12 | Disposition: A | Discharge status: Other Acute Inpatient Hospital | Payer: Medicare Other | Source: Home / Self Care | Attending: Emergency Medicine | Admitting: Emergency Medicine

## 2010-11-12 ENCOUNTER — Emergency Department (HOSPITAL_COMMUNITY): Payer: Medicare Other

## 2010-11-12 DIAGNOSIS — K219 Gastro-esophageal reflux disease without esophagitis: Secondary | ICD-10-CM | POA: Diagnosis present

## 2010-11-12 DIAGNOSIS — R569 Unspecified convulsions: Principal | ICD-10-CM | POA: Diagnosis present

## 2010-11-12 DIAGNOSIS — D649 Anemia, unspecified: Secondary | ICD-10-CM | POA: Diagnosis present

## 2010-11-12 DIAGNOSIS — F329 Major depressive disorder, single episode, unspecified: Secondary | ICD-10-CM | POA: Diagnosis present

## 2010-11-12 DIAGNOSIS — Z79899 Other long term (current) drug therapy: Secondary | ICD-10-CM

## 2010-11-12 DIAGNOSIS — F411 Generalized anxiety disorder: Secondary | ICD-10-CM | POA: Diagnosis present

## 2010-11-12 HISTORY — DX: Major depressive disorder, single episode, unspecified: F32.9

## 2010-11-12 HISTORY — DX: Depression, unspecified: F32.A

## 2010-11-12 HISTORY — DX: Anxiety disorder, unspecified: F41.9

## 2010-11-12 LAB — BASIC METABOLIC PANEL
BUN: 9 mg/dL (ref 6–23)
CO2: 28 mEq/L (ref 19–32)
Calcium: 9.8 mg/dL (ref 8.4–10.5)
Creatinine, Ser: 0.76 mg/dL (ref 0.50–1.10)
Glucose, Bld: 87 mg/dL (ref 70–99)
Sodium: 141 mEq/L (ref 135–145)

## 2010-11-12 LAB — CBC
HCT: 39.5 % (ref 36.0–46.0)
Hemoglobin: 13.4 g/dL (ref 12.0–15.0)
MCH: 31 pg (ref 26.0–34.0)
MCV: 91.4 fL (ref 78.0–100.0)
RBC: 4.32 MIL/uL (ref 3.87–5.11)
WBC: 4.3 10*3/uL (ref 4.0–10.5)

## 2010-11-12 LAB — RAPID URINE DRUG SCREEN, HOSP PERFORMED
Barbiturates: NOT DETECTED
Cocaine: NOT DETECTED
Tetrahydrocannabinol: NOT DETECTED

## 2010-11-12 LAB — DIFFERENTIAL
Eosinophils Absolute: 0.1 10*3/uL (ref 0.0–0.7)
Eosinophils Relative: 3 % (ref 0–5)
Lymphocytes Relative: 24 % (ref 12–46)
Lymphs Abs: 1 10*3/uL (ref 0.7–4.0)
Monocytes Absolute: 0.4 10*3/uL (ref 0.1–1.0)
Monocytes Relative: 10 % (ref 3–12)

## 2010-11-12 LAB — ETHANOL: Alcohol, Ethyl (B): <11 mg/dL (ref 0–11)

## 2010-11-12 LAB — URINALYSIS, ROUTINE W REFLEX MICROSCOPIC
Nitrite: NEGATIVE
Protein, ur: NEGATIVE mg/dL
Urobilinogen, UA: 0.2 mg/dL (ref 0.0–1.0)

## 2010-11-12 LAB — URINE MICROSCOPIC-ADD ON

## 2010-11-12 MED ORDER — SODIUM CHLORIDE 0.9 % IV BOLUS (SEPSIS)
1000.0000 mL | Freq: Once | INTRAVENOUS | Status: AC
  Administered 2010-11-12: 1000 mL via INTRAVENOUS

## 2010-11-12 MED ORDER — SODIUM CHLORIDE 0.9 % IV SOLN: INTRAVENOUS | Status: DC

## 2010-11-12 MED ORDER — LORAZEPAM 2 MG/ML IJ SOLN
INTRAMUSCULAR | Status: AC
  Administered 2010-11-12: 15:00:00
  Filled 2010-11-12: qty 2

## 2010-11-12 MED ORDER — SODIUM CHLORIDE 0.9 % IV SOLN
1000.0000 mg | Freq: Once | INTRAVENOUS | Status: AC
  Administered 2010-11-12: 1000 mg via INTRAVENOUS
  Filled 2010-11-12: qty 20

## 2010-11-12 MED ORDER — LORAZEPAM 2 MG/ML IJ SOLN
2.0000 mg | Freq: Once | INTRAMUSCULAR | Status: AC
  Administered 2010-11-12: 2 mg via INTRAVENOUS

## 2010-11-12 MED ORDER — LORAZEPAM 2 MG/ML IJ SOLN
INTRAMUSCULAR | Status: AC
  Administered 2010-11-12: 2 mg via INTRAVENOUS
  Filled 2010-11-12: qty 1

## 2010-11-12 MED ORDER — LORAZEPAM 2 MG/ML IJ SOLN
INTRAMUSCULAR | Status: AC
  Filled 2010-11-12: qty 1

## 2010-11-12 MED ORDER — LORAZEPAM 2 MG/ML IJ SOLN
2.0000 mg | Freq: Once | INTRAMUSCULAR | Status: AC
  Administered 2010-11-12: 2 mg via INTRAVENOUS
  Filled 2010-11-12: qty 1

## 2010-11-12 NOTE — ED Notes (Signed)
Called pharmacy, they are working on dilantin drip

## 2010-11-12 NOTE — ED Notes (Signed)
Family at bedside. 

## 2010-11-12 NOTE — ED Notes (Signed)
Family at bedside. Patient would like something to drink. RN Hospital doctor aware.

## 2010-11-12 NOTE — ED Notes (Signed)
[facility] called and reported called, ETA of 15 min.

## 2010-11-12 NOTE — ED Notes (Signed)
Pt drinking and eating without difficulty. Alert and oriented x 3. Skin warm and dry. Color pink. No seizure activity at this time.

## 2010-11-12 NOTE — ED Notes (Signed)
Called in by family stating that she was having Alvarez seizure again, dr. Onalee Hua already in room and verbal order given to give 2mg  IV Ativan, med pulled and given per order, Elaine Alvarez&O after seizure activity stopped and c/o "being tired"

## 2010-11-12 NOTE — ED Notes (Signed)
Pt had seizure activity x 2 minutes beginning with staring and progressing to shaking of extremities. Pt not responding to verbal command.

## 2010-11-12 NOTE — ED Notes (Signed)
Per ems pt had a witnessed seizure at a restaurant.  Pt does not remember what happened.  Daughters reports that she told them "my heart feels likes its beating out of my chest" before the seizure happened.  Family denied any fall or head injury.  Family states that they lowered her to the floor.  Per ems, pt could not respond appropriately to questions.  Pt is now more alert.  Pt also reports a hemorrhoid that is bleeding into a full pad everyday and some sob.

## 2010-11-12 NOTE — ED Notes (Signed)
Attempted to call report to receiving nurse Nedra Hai, unable to give at this time and he will call back when able to take report, informed that [facility] will be en route

## 2010-11-12 NOTE — ED Notes (Signed)
Pt awake and alert at present. Oriented to person but not place and time. VSS.

## 2010-11-12 NOTE — ED Notes (Signed)
Called to room by family stating that the patient was having a seizure again. Pt had hands flexed and when I started talking to her she relaxed and opened her eyes. CCM showing NSR with no change in HR. Pt moaning while having ? Seizure. VSS.

## 2010-11-12 NOTE — ED Provider Notes (Signed)
History     CSN: [id number] Arrival date & time: 11/12/2010  2:15 PM  Chief Complaint  Patient presents with  . Seizures   HPI patient was eating at restaurant with her family. Daughter reports patient having a generalized tonic-clonic seizure. Since never happened before. No history of alcohol abuse. She takes Xanax on a regular basis. Apparently patient had a seizure workup several years ago by Dr. in [place] including EEG. Unknown results. Level V caveat for urgent need for intervention in serious illness  Past Medical History  Diagnosis Date  . Depression   . Anxiety     Past Surgical History  Procedure Date  . Cholecystectomy   . Abdominal hysterectomy   . Prolapsed uterine fibroid ligation     No family history on file.  History  Substance Use Topics  . Smoking status: Never Smoker   . Smokeless tobacco: Not on file  . Alcohol Use: No    OB History    Grav Para Term Preterm Abortions TAB SAB Ect Mult Living                  Review of Systems  Unable to perform ROS: Mental status change    Physical Exam  There were no vitals taken for this visit.  Physical Exam  Nursing note and vitals reviewed. Constitutional: She appears well-developed and well-nourished.       Patient appears confused, unable to follow commands.  HENT:  Head: Normocephalic and atraumatic.  Eyes: Conjunctivae and EOM are normal. Pupils are equal, round, and reactive to light.  Neck: Normal range of motion. Neck supple.  Cardiovascular: Normal rate and regular rhythm.   Pulmonary/Chest: Effort normal and breath sounds normal.  Abdominal: Soft. Bowel sounds are normal.  Musculoskeletal: Normal range of motion.  Neurological:       Patient can move arms and legs but not to appropriate commands. Appears very confused  Skin: Skin is warm and dry.  Psychiatric:       Unable    ED Course  CRITICAL CARE Performed by: Donnetta Hutching Authorized by: Donnetta Hutching Total critical care time: 30  minutes Critical care was time spent personally by me on the following activities: discussions with consultants, evaluation of patient's response to treatment, ordering and review of laboratory studies, pulse oximetry, re-evaluation of patient's condition, ordering and review of radiographic studies, ordering and performing treatments and interventions, examination of patient and development of treatment plan with patient or surrogate.    Date: 11/12/2010  Rate: 63  Rhythm: normal sinus rhythm  QRS Axis: normal  Intervals: normal  ST/T Wave abnormalities: normal  Conduction Disutrbances:none  Narrative Interpretation:   Old EKG Reviewed: none available  MDM History sounds like new seizure. Patient is in post ictal phase will get CT scan of head, labs, drug screen. Intravenous Ativan and Dilantin started. Probable admission.      Donnetta Hutching, MD 11/12/10 [phone]

## 2010-11-12 NOTE — ED Notes (Signed)
Family called [place] in b/c pt was having a seizure.  Pt was actively seizing upon arrival to room.  Seizure lasted for about 3 mins.  Pt was unable to answer any questions after the seizure.

## 2010-11-12 NOTE — H&P (Signed)
Elaine Alvarez, Elaine Alvarez               ACCOUNT NO.:  [id number]  MEDICAL RECORD NO.:  [id number]  LOCATION:  APA12                         FACILITY:  [facility]  PHYSICIAN:  Tarry Kos, MD       DATE OF BIRTH:  [birth year]  DATE OF ADMISSION:  11/12/2010 DATE OF DISCHARGE:  LH                             HISTORY & PHYSICAL   CHIEF COMPLAINT:  Seizure.  HISTORY OF PRESENT ILLNESS:  Elaine Alvarez is a 65 year old female with a history of major depression and questionable seizure disorder that was diagnosed approximately 2 years ago who presents to the ED after having multiple witnessed seizures by her daughter and the husband.  Her daughter describes that she all of a sudden upper extremity started shaking and she was awake.  Her eyes were open when this happened but within a minute her eyes rolled back and she slumped over.  Her daughter caught her fall and then within 2 minutes the seizure broke, she came to and she was little confused and combative afterward and then about 5 minutes later she had another episode where she immediately started having upper extremity shaking bilaterally.  Her eyes rolled back and she slumped over again.  They called 911.  She came to the hospital.  I think she had another seizure episode while she was in the ED.  She has been loaded with Dilantin per Neurology recommendations at [facility] and we are being asked to transfer the patient to [facility] for further neurological workup.  When I spoke to Elaine Alvarez the first time she was totally with it, she was not postictal.  When these activities occurred, she did not have any urinary or bowel incontinence.  She denies running any fevers.  She denies any nausea, vomiting, abdominal pain or chest pain.  She does say that she has been really sort of depressed mood over the last week and has been feeling very tired.  She describes her history of seizures that were diagnosed 2 years ago as she would just stare into  space and she thinks that she was diagnosed with partial seizures but she is really not sure.  She was never put on any seizure medications.  This is the first time she has really had a seizure to this extent.  She is chronically on benzodiazepines in the form of Xanax.  She takes about 3-4 times a day.  She has been taking that.  She has not run out of it.  Again she denies any fevers or any other recent illnesses.  Approximately an hour after I did the history from Elaine Alvarez, she witnessed another seizure that lasted less than 2 minutes.  It was tonic- clonic in nature, bilateral upper extremities.  The patient was unresponsive for that period of time.  Her vital signs remained stable throughout the seizure.  It broke spontaneously in less than 2 minutes. I did order her Ativan 2 mg IV in the ED but by the time she got the Ativan her seizure had broke and she was starting to respond a little bit.  She did not have any urinary or bowel incontinence at this time either.  Her  O2 sats remained above 95% with oxygen supplementation during the seizure.  REVIEW OF SYSTEMS:  Otherwise negative.  PAST MEDICAL HISTORY: 1. Major depression. 2. Anxiety. 3. Asthma. 4. Constipation. 5. GERD. 6. Peptic ulcer disease. 7. Rectocele without mention of uterine prolapse. 8. Anemia. 9. She has not had any recent changes in psychiatric medications.  ALLERGIES:  SULFA causes a rash and swelling.  MEDICATIONS: 1. Xanax 1 mg 3 times a day. 2. Ibuprofen as needed. 3. Inderal 10 mg 3 times daily. 4. Zantac 75 mg daily as needed. 5. Trazodone 200 mg nightly. 6. Effexor 75 mg 2 times daily. 7. She received Dilantin 1000 mg in the ED.  SOCIAL HISTORY:  She is a nonsmoker.  No alcohol.  No IV drug abuse.  PHYSICAL EXAMINATION:  VITAL SIGNS:  Temperature is 97.6, pulse 58, respirations 12, blood pressure 137/68, 98% O2 sats on room air. GENERAL:  She is alert and oriented x4 on initial evaluation  in no apparent distress. HEENT:  Extraocular movements intact.  Pupils are equal, round and reactive to light.  Oropharynx clear.  Mucosa is moist. NECK:  No JVD.  No carotid bruits. HEART:  Regular rate and rhythm without murmurs, rubs or gallops. CHEST:  Clear to auscultation.  No wheezes, rhonchi, or rales. ABDOMEN:  Soft, nontender, nondistended, positive bowel sounds.  No hepatosplenomegaly. EXTREMITIES:  No clubbing, cyanosis or edema. PSYCH:  Normal affect. NEURO:  No focal neurologic deficits.  Cranial II-XII grossly intact. SKIN:  No rashes.  Again I did witnessed a seizure while she was in the ED that was brief less than 2 minutes as described above.  LABORATORY FINDINGS:  BMP is normal.  White count is normal.  CBC is normal.  Differential is normal.  Glucose is normal.  Urinalysis is negative.  Alcohol level is less than 11.  Urine drug screen is only positive for benzodiazepines.  CT of her head shows no acute intracranial abnormalities.  ASSESSMENT AND PLAN:  This is a 65 year old female with uncontrolled seizures. 1. Uncontrolled seizures with questionable history of seizure disorder     that has not currently been on any antiepileptic medications.  Dr.     Thad Ranger at [facility] has recommended transfer and for them to be     consulted.  They are aware of the patient.  She is being     transferred to the step-down unit at [facility].  I have written     for an Ativan as needed order.  I have called the mid level who was     on call at [facility] to check on the patient later on.  She is going to     obtain neurological checks every 4 hours and be placed on seizure     precautions.  Further workup per Neurology recommendations.  We     will keep her n.p.o. at this time until she is evaluated by     Neurology and until we can get her seizures better controlled. 2. History of major depression.  I am going to hold her medications at     this time including her Effexor. 3.  Anxiety disorder.  I am going to hold her Xanax also at this time.     She will be getting Ativan as needed. 4. Further recommendation pending over hospital course.    ______________________________ Tarry Kos, MD   ______________________________ Tarry Kos, MD    RD/MEDQ  D:  11/12/2010  T:  11/12/2010  Job:  [id number]

## 2010-11-12 NOTE — ED Notes (Signed)
Taking pt some food per edp

## 2010-11-12 NOTE — ED Notes (Signed)
Family at bedside. Patient wants something to eat.

## 2010-11-12 NOTE — ED Notes (Signed)
Pt alert and oriented x 3. Skin warm and dry. Color pink. CCM showing NSR.

## 2010-11-12 NOTE — ED Notes (Signed)
Pt to CT

## 2010-11-12 NOTE — ED Notes (Signed)
Report called to [facility] on unit on 3300

## 2010-11-12 NOTE — ED Notes (Addendum)
Called in room for another seizure, patient tensed and not responsive during seizure activity, lasted for about 30-45 seconds, patient become talkative afterwards, alert and oriented to place and person, stated that year was 2011, able to state president of [place], Dr. Onalee Hua called and notified of above, telephone order to give another dose of ativan Iv

## 2010-11-13 LAB — MRSA PCR SCREENING: MRSA by PCR: NEGATIVE

## 2010-11-13 LAB — PROCALCITONIN: Procalcitonin: <0.1 ng/mL

## 2010-11-14 LAB — COMPREHENSIVE METABOLIC PANEL
ALT: 14 U/L (ref 0–35)
Alkaline Phosphatase: 76 U/L (ref 39–117)
CO2: 32 mEq/L (ref 19–32)
GFR calc Af Amer: >60 mL/min (ref >60)
GFR calc non Af Amer: >60 mL/min (ref >60)
Glucose, Bld: 92 mg/dL (ref 70–99)
Potassium: 3.8 mEq/L (ref 3.5–5.1)
Sodium: 139 mEq/L (ref 135–145)
Total Bilirubin: 0.5 mg/dL (ref 0.3–1.2)

## 2010-11-14 LAB — CBC
Hemoglobin: 13.5 g/dL (ref 12.0–15.0)
RBC: 4.46 MIL/uL (ref 3.87–5.11)

## 2010-11-16 NOTE — Discharge Summary (Signed)
Elaine Alvarez, Elaine Alvarez               ACCOUNT NO.:  [id number]  MEDICAL RECORD NO.:  [id number]  LOCATION:  3028                         FACILITY:  [facility]  PHYSICIAN:  Conley Canal, MD      DATE OF BIRTH:  Feb 01, 1946  DATE OF ADMISSION:  11/12/2010 DATE OF DISCHARGE:  11/14/2010                        DISCHARGE SUMMARY - REFERRING   PRIMARY CARE PROVIDER:  Patient has no primary care provider.  PSYCHIATRIST:  Dr. Betti Cruz.  DISCHARGE DIAGNOSES: 1. Nonepileptic seizures. 2. Anxiety, depression disorder. 3. Gastroesophageal reflux disease. 4. Chronic anemia. 5. History of rectocele.  DISCHARGE MEDICATIONS: 1. Venlafaxine 75 mg twice daily. 2. Xanax 1 mg t.i.d.  No new prescription given. 3. Propranolol 10 mg 3 times daily. 4. Ranitidine 75 mg daily as needed.  PROCEDURES PERFORMED:  CT of the brain without contrast September 12 showed some atrophy with minimal small-vessel chronic ischemic changes of deep cerebral white matter.  No acute intracranial abnormalities.  HOSPITAL COURSE:  Elaine Alvarez is Elaine pleasant 65 year old Alvarez who came in with reported history of seizure-like activity.  At the time of admission, the patient had seizure-like activity in the emergency room requiring loading dose of Dilantin as well as several doses of Ativan to try to abort the seizures which apparently did not improve.  She was hence admitted to the Intensive Care Unit for closer monitoring; but in the Intensive Care Unit, she continued to have these episodes of seizure- like activity; but during the course of the hospitalization, she was not noted to have any urinary or fecal incontinence and even today I have received 3 phone calls from the nursing staff with reports of seizure- like activity.  These episodes improved on their own without any intervention; and at the time of my evaluation, the patient is postictal.  She reports that she had extensive workup for suspected seizures about 3-5  years ago including Elaine 24-hour EEG which was reportedly normal and the neurologist did not find anything responsible for these seizures.  She reports history of head trauma about 10 years ago which happened at home but she apparently has not had any sequelae of brain injury other than this suggestion of seizures now.  She has Elaine psychiatric history for major depression, anxiety for which she is currently on Xanax and Effexor.  She has been following Dr. Betti Cruz but she says she is not happy with him and she plans to change to another psychiatrist.  The other possibility would be for benzodiazepine withdrawal seizures, but the patient mentioned that she has been taking her benzodiazepines religiously.  I have raised concerns that the doses she is taking are probably on the high side and that she should not quit taking the medication suddenly in case she goes into withdrawal seizures.  However, I have deferred the adjustment of these medications to her psychiatrist.  She has not followed with the primary care physician in recent years because of insurance issues, although she states that she has Medicare.  I have hence encouraged her to establish care with the primary care provider who would systematically further evaluate these episodes which seem non-life-threatening at least from the evaluation since she was admitted 3 days ago.  Her lab tests included white count of 3.6, hemoglobin 18.5, hematocrit 39.9, platelet count 109.  Electrolytes normal.  Procalcitonin checked on September 13 during Elaine seizure episode was normal.  Urine drug screen was positive for benzodiazepines.  The main issue is that she needs to follow up with primary care provider; and if she continues to have episodes, she will need further workup including referral to Elaine neurologist; and if as it seems this still would deem to be pseudoseizure, then she might benefit from some psychiatric intervention.  I do not believe we  need to initiate any therapy at this point.  The patient is hence discharged in Elaine stable condition to the care of her family including Elaine supportive daughter.  The daughter knows to call 911 if she has recurrence of symptoms.  If in fact she has recurrence of symptoms, she may need further workup including repeating EEG, MRI of the brain.  At this point, the patient will wait before we can do these tests.  The time spent for discharge preparation is more than 30 minutes.     Conley Canal, MD     SR/MEDQ  D:  11/14/2010  T:  11/14/2010  Job:  [id number]  cc:   Daine Floras, M.D.  Electronically Signed by Conley Canal  on 11/16/2010 03:56:39 PM

## 2010-11-25 LAB — BASIC METABOLIC PANEL
BUN: 8
Calcium: 9.3
Creatinine, Ser: 0.85
GFR calc non Af Amer: >60

## 2010-11-25 LAB — CBC
MCV: 90.5
Platelets: 136 — ABNORMAL LOW
WBC: 4.6

## 2010-11-25 LAB — URINALYSIS, ROUTINE W REFLEX MICROSCOPIC
Glucose, UA: NEGATIVE
Protein, ur: NEGATIVE
Specific Gravity, Urine: 1.025
pH: 5.5

## 2010-11-25 LAB — URINE MICROSCOPIC-ADD ON

## 2010-11-25 LAB — DIFFERENTIAL: Lymphs Abs: 1.1

## 2012-01-13 ENCOUNTER — Other Ambulatory Visit (HOSPITAL_COMMUNITY): Payer: Self-pay | Admitting: Physician Assistant

## 2012-01-13 DIAGNOSIS — Z780 Asymptomatic menopausal state: Secondary | ICD-10-CM

## 2012-01-13 DIAGNOSIS — Z139 Encounter for screening, unspecified: Secondary | ICD-10-CM

## 2012-01-26 ENCOUNTER — Other Ambulatory Visit (HOSPITAL_COMMUNITY): Payer: Self-pay | Admitting: Physician Assistant

## 2012-01-26 ENCOUNTER — Ambulatory Visit (HOSPITAL_COMMUNITY)
Admission: RE | Admit: 2012-01-26 | Discharge: 2012-01-26 | Disposition: A | Discharge status: Home / Self Care | Payer: Medicare Other | Source: Ambulatory Visit | Attending: Physician Assistant | Admitting: Physician Assistant

## 2012-01-26 DIAGNOSIS — Z780 Asymptomatic menopausal state: Secondary | ICD-10-CM | POA: Insufficient documentation

## 2012-01-26 DIAGNOSIS — Z13820 Encounter for screening for osteoporosis: Secondary | ICD-10-CM | POA: Insufficient documentation

## 2012-01-26 DIAGNOSIS — Z139 Encounter for screening, unspecified: Secondary | ICD-10-CM

## 2012-01-26 DIAGNOSIS — Z1231 Encounter for screening mammogram for malignant neoplasm of breast: Secondary | ICD-10-CM | POA: Insufficient documentation

## 2012-02-05 ENCOUNTER — Encounter: Payer: Self-pay | Admitting: Internal Medicine

## 2012-02-08 ENCOUNTER — Encounter (HOSPITAL_COMMUNITY): Payer: Self-pay | Admitting: Pharmacy Technician

## 2012-02-08 ENCOUNTER — Encounter: Payer: Self-pay | Admitting: Gastroenterology

## 2012-02-08 ENCOUNTER — Ambulatory Visit (INDEPENDENT_AMBULATORY_CARE_PROVIDER_SITE_OTHER): Payer: Medicare Other | Admitting: Gastroenterology

## 2012-02-08 VITALS — BP 131/78 | HR 69 | Temp 97.4°F | Ht 63.5 in | Wt 207.2 lb

## 2012-02-08 DIAGNOSIS — K625 Hemorrhage of anus and rectum: Secondary | ICD-10-CM

## 2012-02-08 LAB — CBC WITH DIFFERENTIAL/PLATELET
Basophils Absolute: 0.1 10*3/uL (ref 0.0–0.1)
Eosinophils Absolute: 0.2 10*3/uL (ref 0.0–0.7)
Eosinophils Relative: 4 % (ref 0–5)
Lymphocytes Relative: 30 % (ref 12–46)
MCV: 88.8 fL (ref 78.0–100.0)
Platelets: 143 10*3/uL — ABNORMAL LOW (ref 150–400)
RDW: 13.3 % (ref 11.5–15.5)
WBC: 5 10*3/uL (ref 4.0–10.5)

## 2012-02-08 MED ORDER — HYDROCORTISONE ACETATE 25 MG RE SUPP: 25.0000 mg | Freq: Two times a day (BID) | RECTAL | Status: DC

## 2012-02-08 MED ORDER — PEG 3350-KCL-NA BICARB-NACL 420 G PO SOLR: 4000.0000 mL | ORAL | Status: DC

## 2012-02-08 NOTE — Progress Notes (Signed)
Primary Care Physician:  WILSON,FRED HENRY, MD Primary Gastroenterologist:  Dr. Rourk   Chief Complaint  Patient presents with  . Rectal Bleeding  . Hemorrhoids    HPI:   66-year-old female who presents today at the request of Dr. Moore (GYN in [place]) secondary to rectal bleeding. Last colonoscopy in 2009 by Dr. Rourk with hyperplastic polyps. Hx of rectocele repair approximately a year ago.  Rectal bleeding X 1 month. Started off small amount, then got worse. Sometimes will just come out in the toilet when urinating. +Paper hematochezia and blood in toilet. Notes large hemorrhoid. No itching. +rectal pain. States looks like a big piece of meat hanging out of her anus. +incontinence of stool. New within last 2-3 weeks. Feels urge to go but unable to make it. Takes Metamucil when feels like constipated. +stool softeners prn. Notes lower abdominal discomfort, not r/t bowel habits. Intermittent. No loss of appetite. Feels like she is gaining weight.   Husband is ill, has chronic pancreatitis. Recently diagnosed with Diabetes. Aleve prn joint pain and headaches.   Past Medical History  Diagnosis Date  . Depression   . Anxiety   . GERD (gastroesophageal reflux disease)     Past Surgical History  Procedure Date  . Cholecystectomy   . Abdominal hysterectomy   . Prolapsed uterine fibroid ligation   . Esophagogastroduodenoscopy 08/03/2007    RMR:short benign tubular peptic stricture status post dilation/   Hiatal hernia, erosive/ulcerative esophagitis  . Colonoscopy 08/03/2007    RMR: diminutive distal rectal polyps (2), Left-sided transverse diverticula HYPERPLASTIC POLYPS  . Rectocele repair     Current Outpatient Prescriptions  Medication Sig Dispense Refill  . clonazePAM (KLONOPIN) 1 MG tablet Take 1 mg by mouth 2 (two) times daily as needed. Anxiety.      . omeprazole (PRILOSEC) 20 MG capsule Take 20 mg by mouth daily.      . venlafaxine (EFFEXOR) 75 MG tablet Take 75 mg by  mouth 2 (two) times daily.        . hydrocortisone (ANUSOL-HC) 25 MG suppository Place 1 suppository (25 mg total) rectally every 12 (twelve) hours.  14 suppository  1  . polyethylene glycol-electrolytes (TRILYTE) 420 G solution Take 4,000 mLs by mouth as directed.  4000 mL  0    Allergies as of 02/08/2012 - Review Complete 02/08/2012  Allergen Reaction Noted  . Sulfonamide derivatives      Family History  Problem Relation Age of Onset  . Colon cancer Neg Hx   . Transient ischemic attack Mother   . Lung cancer Father     History   Social History  . Marital Status: Married    Spouse Name: N/A    Number of Children: N/A  . Years of Education: N/A   Occupational History  . retired    Social History Main Topics  . Smoking status: Never Smoker   . Smokeless tobacco: Not on file  . Alcohol Use: No  . Drug Use: No  . Sexually Active: Not on file   Other Topics Concern  . Not on file   Social History Narrative  . No narrative on file    Review of Systems: Gen: +fatigue, "tired all the time" within last year, just wants to sleep CV: Denies chest pain, heart palpitations, peripheral edema, syncope.  Resp: Denies shortness of breath at rest or with exertion. Denies wheezing or cough.  GI: SEE HPI GU : UTI, on tx with Cipro  MS: +joint pain Derm: Denies   rash, itching, dry skin Psych: +depression, anxiety Heme: Denies bruising, bleeding, and enlarged lymph nodes.  Physical Exam: BP 131/78  Pulse 69  Temp 97.4 F (36.3 C) (Oral)  Ht 5' 3.5" (1.613 m)  Wt 207 lb 3.2 oz (93.985 kg)  BMI 36.13 kg/m2 General:   Alert and oriented. Pleasant and cooperative. Well-nourished and well-developed.  Head:  Normocephalic and atraumatic. Eyes:  Without icterus, sclera clear and conjunctiva pink.  Ears:  Normal auditory acuity. Nose:  No deformity, discharge,  or lesions. Mouth:  No deformity or lesions, oral mucosa pink.  Neck:  Supple, without mass or thyromegaly. Lungs:   Clear to auscultation bilaterally. No wheezes, rales, or rhonchi. No distress.  Heart:  S1, S2 present without murmurs appreciated.  Abdomen:  +BS, soft, non-tender and non-distended. No HSM noted. No guarding or rebound. No masses appreciated.  Rectal:  Poor anal sphincter tone, query mild rectal prolapse, no mass noted with internal exam, no gross blood noted Msk:  Symmetrical without gross deformities. Normal posture. Extremities:  Without clubbing or edema. Neurologic:  Alert and  oriented x4;  grossly normal neurologically. Skin:  Intact without significant lesions or rashes. Cervical Nodes:  No significant cervical adenopathy. Psych:  Alert and cooperative. Normal mood and affect.    

## 2012-02-08 NOTE — Patient Instructions (Addendum)
Please have blood work done. We will call you with the results.  We have set you up for a colonoscopy with Dr. Jena Gauss in the near future.   Start taking supplemental fiber, Metamucil, daily.   Start the suppositories to help relieve the pain twice a day for 7 days.   Further recommendations in the near future.

## 2012-02-14 ENCOUNTER — Encounter: Payer: Self-pay | Admitting: Gastroenterology

## 2012-02-14 DIAGNOSIS — K625 Hemorrhage of anus and rectum: Secondary | ICD-10-CM | POA: Insufficient documentation

## 2012-02-14 NOTE — Progress Notes (Signed)
Quick Note:  No anemia on CBC. Very mild thrombocytopenia, PLTs 143.  Recheck 8 weeks ______

## 2012-02-14 NOTE — Assessment & Plan Note (Addendum)
66 year old female with last TCS in 2009 by Dr. Jena Gauss; hyperplastic polyps noted. Now notes new onset rectal bleeding, moderate to large amount. Also notes fecal incontinence for past several weeks. Query ?rectal prolapse. TCS for evaluation in near future. CBC today, supplemental fiber daily.   Proceed with TCS with Dr. Jena Gauss in near future: the risks, benefits, and alternatives have been discussed with the patient in detail. The patient states understanding and desires to proceed. Anusol BID X 7 days

## 2012-02-15 ENCOUNTER — Other Ambulatory Visit: Payer: Self-pay | Admitting: Gastroenterology

## 2012-02-15 DIAGNOSIS — D696 Thrombocytopenia, unspecified: Secondary | ICD-10-CM

## 2012-02-15 NOTE — Progress Notes (Signed)
Faxed to PCP

## 2012-02-16 MED ORDER — SODIUM CHLORIDE 0.45 % IV SOLN
INTRAVENOUS | Status: DC
  Administered 2012-02-17: 09:00:00 via INTRAVENOUS

## 2012-02-17 ENCOUNTER — Encounter (HOSPITAL_COMMUNITY): Payer: Self-pay | Admitting: *Deleted

## 2012-02-17 ENCOUNTER — Ambulatory Visit (HOSPITAL_COMMUNITY)
Admission: RE | Admit: 2012-02-17 | Discharge: 2012-02-17 | Disposition: A | Discharge status: Home / Self Care | Payer: Medicare Other | Source: Ambulatory Visit | Attending: Internal Medicine | Admitting: Internal Medicine

## 2012-02-17 ENCOUNTER — Encounter (HOSPITAL_COMMUNITY)
Admission: RE | Disposition: A | Discharge status: Home / Self Care | Payer: Self-pay | Source: Ambulatory Visit | Attending: Internal Medicine

## 2012-02-17 DIAGNOSIS — K5730 Diverticulosis of large intestine without perforation or abscess without bleeding: Secondary | ICD-10-CM | POA: Insufficient documentation

## 2012-02-17 DIAGNOSIS — K648 Other hemorrhoids: Secondary | ICD-10-CM | POA: Insufficient documentation

## 2012-02-17 DIAGNOSIS — D126 Benign neoplasm of colon, unspecified: Secondary | ICD-10-CM | POA: Insufficient documentation

## 2012-02-17 DIAGNOSIS — K625 Hemorrhage of anus and rectum: Secondary | ICD-10-CM | POA: Insufficient documentation

## 2012-02-17 HISTORY — PX: COLONOSCOPY: SHX5424

## 2012-02-17 SURGERY — COLONOSCOPY
Anesthesia: Moderate Sedation

## 2012-02-17 MED ORDER — MEPERIDINE HCL 100 MG/ML IJ SOLN
INTRAMUSCULAR | Status: AC
  Filled 2012-02-17: qty 1

## 2012-02-17 MED ORDER — MIDAZOLAM HCL 5 MG/5ML IJ SOLN
INTRAMUSCULAR | Status: AC
  Filled 2012-02-17: qty 10

## 2012-02-17 MED ORDER — MIDAZOLAM HCL 5 MG/5ML IJ SOLN
INTRAMUSCULAR | Status: DC | PRN
  Administered 2012-02-17: 1 mg via INTRAVENOUS
  Administered 2012-02-17 (×2): 2 mg via INTRAVENOUS
  Administered 2012-02-17: 1 mg via INTRAVENOUS

## 2012-02-17 MED ORDER — STERILE WATER FOR IRRIGATION IR SOLN
Status: DC | PRN
  Administered 2012-02-17: 10:00:00

## 2012-02-17 MED ORDER — MEPERIDINE HCL 100 MG/ML IJ SOLN
INTRAMUSCULAR | Status: DC | PRN
  Administered 2012-02-17: 25 mg via INTRAVENOUS
  Administered 2012-02-17: 50 mg via INTRAVENOUS

## 2012-02-17 NOTE — Op Note (Signed)
[facility] [address] [place], [address]   COLONOSCOPY PROCEDURE REPORT  PATIENT: Elaine Alvarez, Elaine Alvarez  MR#:         [id number] BIRTHDATE: 05-07-1945 , 66  yrs. old GENDER: Female ENDOSCOPIST: R.  Roetta Sessions, MD FACP FACG REFERRED BY:  Benedetto Goad, MD   Dr. Moore-gynecologist PROCEDURE DATE:  02/17/2012 PROCEDURE:     colonoscopy with biopsy  INDICATIONS: rectal discomfort/rectal bleeding/? rectal prolapse  INFORMED CONSENT:  The risks, benefits, alternatives and imponderables including but not limited to bleeding, perforation as well as the possibility of a missed lesion have been reviewed.  The potential for biopsy, lesion removal, etc. have also been discussed.  Questions have been answered.  All parties agreeable. Please see the history and physical in the medical record for more information.  MEDICATIONS: Versed 6 mg IV and Demerol 75 mg IV in divided doses.  DESCRIPTION OF PROCEDURE:  After a digital rectal exam was performed, the Pentax Colonoscope [id number]  colonoscope was advanced from the anus through the rectum and colon to the area of the cecum, ileocecal valve and appendiceal orifice.  The cecum was deeply intubated.  These structures were well-seen and photographed for the record.  From the level of the cecum and ileocecal valve, the scope was slowly and cautiously withdrawn.  The mucosal surfaces were carefully surveyed utilizing scope tip deflection to facilitate fold flattening as needed.  The scope was pulled down into the rectum where a thorough examination including retroflexion was performed.    FINDINGS:  Adequate preparation. Internal hemorrhoids. Areas of thickened fibrotic-appearing rectal mucosa suspicious for prolapse effect.  Pancolonic diverticulosis; (1) diminutive polyp in the mid descending segment; otherwise, the remainder of colonic mucosa appeared normal. THERAPEUTIC / DIAGNOSTIC MANEUVERS PERFORMED:   The  above-mentioned polyp was cold biopsied/removed. The abnormal-appearing rectal mucosa was biopsied for histologic study.  COMPLICATIONS: None  CECAL WITHDRAWAL TIME:  13 minutes  IMPRESSION:  Internal hemorrhoids; probable rectal prolapse - status post rectal mucosal biopsy. Colonic polyp-biopsied/removed. Pancolonic diverticulosis  RECOMMENDATIONS: Followup on pathology. Daily fiber supplement. Followup  withDr. Christell Constant   _______________________________ eSigned:  R. Roetta Sessions, MD FACP [facility] 02/17/2012 10:16 AM   CC:    PATIENT NAME:  Madora, Barletta MR#: [id number]

## 2012-02-17 NOTE — Interval H&P Note (Signed)
History and Physical Interval Note:  02/17/2012 9:29 AM  Elaine Alvarez  has presented today for surgery, with the diagnosis of Rectal Bleeding  The various methods of treatment have been discussed with the patient and family. After consideration of risks, benefits and other options for treatment, the patient has consented to  Procedure(s) (LRB) with comments: COLONOSCOPY (N/A) - 9:15 as a surgical intervention .  The patient's history has been reviewed, patient examined, no change in status, stable for surgery.  I have reviewed the patient's chart and labs.  Questions were answered to the patient's satisfaction.     Eula Listen  As outlined above. Colonoscopy today per plan.The risks, benefits, limitations, alternatives and imponderables have been reviewed with the patient. Potential for esophageal dilation, biopsy, etc. have also been reviewed.  Questions have been answered. All parties agreeable.

## 2012-02-17 NOTE — H&P (View-Only) (Signed)
Primary Care Physician:  Pamelia Hoit, MD Primary Gastroenterologist:  Dr. Jena Gauss   Chief Complaint  Patient presents with  . Rectal Bleeding  . Hemorrhoids    HPI:   66 year old female who presents today at the request of Dr. Christell Constant (GYN in [place]) secondary to rectal bleeding. Last colonoscopy in 2009 by Dr. Jena Gauss with hyperplastic polyps. Hx of rectocele repair approximately a year ago.  Rectal bleeding X 1 month. Started off small amount, then got worse. Sometimes will just come out in the toilet when urinating. +Paper hematochezia and blood in toilet. Notes large hemorrhoid. No itching. +rectal pain. States looks like a big piece of meat hanging out of her anus. +incontinence of stool. New within last 2-3 weeks. Feels urge to go but unable to make it. Takes Metamucil when feels like constipated. +stool softeners prn. Notes lower abdominal discomfort, not r/t bowel habits. Intermittent. No loss of appetite. Feels like she is gaining weight.   Husband is ill, has chronic pancreatitis. Recently diagnosed with Diabetes. Aleve prn joint pain and headaches.   Past Medical History  Diagnosis Date  . Depression   . Anxiety   . GERD (gastroesophageal reflux disease)     Past Surgical History  Procedure Date  . Cholecystectomy   . Abdominal hysterectomy   . Prolapsed uterine fibroid ligation   . Esophagogastroduodenoscopy 08/03/2007    [id number]:[id number] benign tubular peptic stricture status post dilation/   Hiatal hernia, erosive/ulcerative esophagitis  . Colonoscopy 08/03/2007    RMR: diminutive distal rectal polyps (2), Left-sided transverse diverticula HYPERPLASTIC POLYPS  . Rectocele repair     Current Outpatient Prescriptions  Medication Sig Dispense Refill  . clonazePAM (KLONOPIN) 1 MG tablet Take 1 mg by mouth 2 (two) times daily as needed. Anxiety.      Marland Kitchen omeprazole (PRILOSEC) 20 MG capsule Take 20 mg by mouth daily.      Marland Kitchen venlafaxine (EFFEXOR) 75 MG tablet Take 75 mg by  mouth 2 (two) times daily.        . hydrocortisone (ANUSOL-HC) 25 MG suppository Place 1 suppository (25 mg total) rectally every 12 (twelve) hours.  14 suppository  1  . polyethylene glycol-electrolytes (TRILYTE) 420 G solution Take 4,000 mLs by mouth as directed.  4000 mL  0    Allergies as of 02/08/2012 - Review Complete 02/08/2012  Allergen Reaction Noted  . Sulfonamide derivatives      Family History  Problem Relation Age of Onset  . Colon cancer Neg Hx   . Transient ischemic attack Mother   . Lung cancer Father     History   Social History  . Marital Status: Married    Spouse Name: N/A    Number of Children: N/A  . Years of Education: N/A   Occupational History  . retired    Social History Main Topics  . Smoking status: Never Smoker   . Smokeless tobacco: Not on file  . Alcohol Use: No  . Drug Use: No  . Sexually Active: Not on file   Other Topics Concern  . Not on file   Social History Narrative  . No narrative on file    Review of Systems: Gen: +fatigue, "tired all the time" within last year, just wants to sleep CV: Denies chest pain, heart palpitations, peripheral edema, syncope.  Resp: Denies shortness of breath at rest or with exertion. Denies wheezing or cough.  GI: SEE HPI GU : UTI, on tx with Cipro  MS: +joint pain Derm: Denies  rash, itching, dry skin Psych: +depression, anxiety Heme: Denies bruising, bleeding, and enlarged lymph nodes.  Physical Exam: BP 131/78  Pulse 69  Temp 97.4 F (36.3 C) (Oral)  Ht 5' 3.5" (1.613 m)  Wt 207 lb 3.2 oz (93.985 kg)  BMI 36.13 kg/m2 General:   Alert and oriented. Pleasant and cooperative. Well-nourished and well-developed.  Head:  Normocephalic and atraumatic. Eyes:  Without icterus, sclera clear and conjunctiva pink.  Ears:  Normal auditory acuity. Nose:  No deformity, discharge,  or lesions. Mouth:  No deformity or lesions, oral mucosa pink.  Neck:  Supple, without mass or thyromegaly. Lungs:   Clear to auscultation bilaterally. No wheezes, rales, or rhonchi. No distress.  Heart:  S1, S2 present without murmurs appreciated.  Abdomen:  +BS, soft, non-tender and non-distended. No HSM noted. No guarding or rebound. No masses appreciated.  Rectal:  Poor anal sphincter tone, query mild rectal prolapse, no mass noted with internal exam, no gross blood noted Msk:  Symmetrical without gross deformities. Normal posture. Extremities:  Without clubbing or edema. Neurologic:  Alert and  oriented x4;  grossly normal neurologically. Skin:  Intact without significant lesions or rashes. Cervical Nodes:  No significant cervical adenopathy. Psych:  Alert and cooperative. Normal mood and affect.

## 2012-02-19 ENCOUNTER — Encounter (HOSPITAL_COMMUNITY): Payer: Self-pay | Admitting: Internal Medicine

## 2012-02-23 ENCOUNTER — Encounter: Payer: Self-pay | Admitting: Internal Medicine

## 2012-02-23 ENCOUNTER — Encounter: Payer: Self-pay | Admitting: *Deleted

## 2012-03-02 HISTORY — PX: HEMORRHOID SURGERY: SHX153

## 2012-03-21 ENCOUNTER — Other Ambulatory Visit: Payer: Self-pay

## 2012-03-21 DIAGNOSIS — D696 Thrombocytopenia, unspecified: Secondary | ICD-10-CM

## 2012-09-27 ENCOUNTER — Encounter: Payer: Self-pay | Admitting: Internal Medicine

## 2012-10-06 ENCOUNTER — Encounter: Payer: Self-pay | Admitting: Gastroenterology

## 2012-10-06 ENCOUNTER — Ambulatory Visit (INDEPENDENT_AMBULATORY_CARE_PROVIDER_SITE_OTHER): Payer: Medicare Other | Admitting: Gastroenterology

## 2012-10-06 VITALS — BP 104/65 | HR 62 | Temp 97.6°F | Ht 63.5 in | Wt 215.8 lb

## 2012-10-06 DIAGNOSIS — K625 Hemorrhage of anus and rectum: Secondary | ICD-10-CM

## 2012-10-06 MED ORDER — HYDROCORTISONE 2.5 % RE CREA: TOPICAL_CREAM | Freq: Two times a day (BID) | RECTAL | Status: DC

## 2012-10-06 NOTE — Progress Notes (Signed)
Referring Provider: Barbie Banner, MD Primary Care Physician:  Pamelia Hoit, MD Primary GI: Dr. Jena Gauss   Chief Complaint  Patient presents with  . Hemorrhoids       HPI:   67 year old female presents today due to persistent rectal bleeding. Recent colonoscopy in Dec 2013 by Dr. Jena Gauss revealed internal hemorrhoids and probable rectal prolapse. Benign polyp.   States glob of hamburger meat where her rectum is located. Leaking stool. Hurting. Was taking Citrucel, worsened bowel movements. Gaining weight.   BM usually every other day. Painful to have bowel movements. No straining. Soft stool. Sometimes abdominal cramping while having a bowel movement. Bright red blood per rectum with every bowel movement. Mucus, sometimes seepage of stool.   Past Medical History  Diagnosis Date  . Depression   . Anxiety   . GERD (gastroesophageal reflux disease)     Past Surgical History  Procedure Laterality Date  . Cholecystectomy    . Abdominal hysterectomy    . Prolapsed uterine fibroid ligation    . Esophagogastroduodenoscopy  08/03/2007    YNW:[id number] benign tubular peptic stricture status post dilation/   Hiatal hernia, erosive/ulcerative esophagitis  . Colonoscopy  08/03/2007    RMR: diminutive distal rectal polyps (2), Left-sided transverse diverticula HYPERPLASTIC POLYPS  . Rectocele repair    . Colonoscopy  02/17/2012    [id number] hemorrhoids; probable rectal prolapse-s/p  rectal mucosal biopsy. Colonic polyp-biopsied/Pancolonic diverticulosis. Benign polyp.     Current Outpatient Prescriptions  Medication Sig Dispense Refill  . clonazePAM (KLONOPIN) 1 MG tablet Take 1 mg by mouth 2 (two) times daily as needed. Anxiety.      Marland Kitchen escitalopram (LEXAPRO) 10 MG tablet Take 10 mg by mouth daily.      Marland Kitchen omeprazole (PRILOSEC) 20 MG capsule Take 20 mg by mouth daily.       No current facility-administered medications for this visit.    Allergies as of 10/06/2012 - Review Complete  10/06/2012  Allergen Reaction Noted  . Sulfonamide derivatives      Family History  Problem Relation Age of Onset  . Colon cancer Neg Hx   . Transient ischemic attack Mother   . Lung cancer Father     History   Social History  . Marital Status: Married    Spouse Name: N/A    Number of Children: N/A  . Years of Education: N/A   Occupational History  . retired    Social History Main Topics  . Smoking status: Never Smoker   . Smokeless tobacco: None  . Alcohol Use: No  . Drug Use: No  . Sexually Active: None   Other Topics Concern  . None   Social History Narrative  . None    Review of Systems: Negative unless mentioned in HPI.   Physical Exam: BP 104/65  Pulse 62  Temp(Src) 97.6 F (36.4 C) (Oral)  Ht 5' 3.5" (1.613 m)  Wt 215 lb 12.8 oz (97.886 kg)  BMI 37.62 kg/m2 General:   Alert and oriented. No distress noted. Pleasant and cooperative.  Head:  Normocephalic and atraumatic. Eyes:  Conjuctiva clear without scleral icterus. Mouth:  Poor dentition  Heart:  S1, S2 present without murmurs, rubs, or gallops. Regular rate and rhythm. Abdomen:  +BS, soft, non-tender and non-distended. No rebound or guarding. No HSM or masses noted. Rectal: prolapsed internal hemorrhoids, easily reducible, possible element of mild rectal prolapse as well.  Msk:  Symmetrical without gross deformities. Normal posture. Extremities:  Without edema. Neurologic:  Alert and  oriented x4;  grossly normal neurologically. Skin:  Intact without significant lesions or rashes. Psych:  Alert and cooperative. Normal mood and affect.

## 2012-10-06 NOTE — Patient Instructions (Addendum)
I have sent a cream to your pharmacy to place rectally twice a day. Use this for 7 days.   Continue fiber daily.   Follow the instructions for care of hemorrhoids: Take warm sitz baths for 15 20 minutes, 3 4 times a day to ease pain and discomfort. Place ice packs on the hemorrhoids if they are tender and swollen. Using ice packs between sitz baths may be helpful. Put ice in a plastic bag. Place a towel between your skin and the bag. Leave the ice on for 15 20 minutes, 3 4 times a day.   I have referred you to Dr. Lovell Sheehan for consideration of repair of possible rectal prolapse in the future, if this is contributing to your symptoms.

## 2012-10-07 NOTE — Assessment & Plan Note (Signed)
67 year old female with low-volume hematochezia and rectal discomfort in the setting of known internal hemorrhoids and likely intermittent rectal prolapse. Colonoscopy on file from Dec 2013 with benign polyp, internal hemorrhoids, probable rectal prolapse. Her description is consistent with intermittent rectal prolapse and exacerbation of internal hemorrhoids. Appears she is doing well from a bowel management perspective, without straining or constipation. Rectal exam with evidence of mildly prolapsed internal hemorrhoids that are easily reducible, and I question an underlying rectal prolapse as well.   At this point, will provide symptomatic treatment with cream, avoidance of constipation or straining, hemorrhoid care, and referral to Dr. Lovell Sheehan for evaluation. Although banding therapy is available for internal hemorrhoids, I feel the rectal prolapse needs to be addressed/evaluated from a surgical perspective, as it seems this is causing most of her symptoms. Further recommendations after she is evaluated from a surgical standpoint.

## 2012-10-10 ENCOUNTER — Other Ambulatory Visit: Payer: Self-pay | Admitting: Gastroenterology

## 2012-10-10 DIAGNOSIS — K625 Hemorrhage of anus and rectum: Secondary | ICD-10-CM

## 2013-02-01 ENCOUNTER — Other Ambulatory Visit (HOSPITAL_COMMUNITY): Payer: Self-pay | Admitting: Family Medicine

## 2013-02-01 DIAGNOSIS — Z139 Encounter for screening, unspecified: Secondary | ICD-10-CM

## 2013-02-03 ENCOUNTER — Ambulatory Visit (INDEPENDENT_AMBULATORY_CARE_PROVIDER_SITE_OTHER): Payer: Medicare Other | Admitting: Pulmonary Disease

## 2013-02-03 ENCOUNTER — Encounter: Payer: Self-pay | Admitting: Pulmonary Disease

## 2013-02-03 VITALS — BP 124/76 | HR 69 | Temp 98.2°F | Ht 64.5 in | Wt 216.2 lb

## 2013-02-03 DIAGNOSIS — J45909 Unspecified asthma, uncomplicated: Secondary | ICD-10-CM

## 2013-02-03 NOTE — Progress Notes (Signed)
Subjective:    Patient ID: Elaine Alvarez, female    DOB: Feb 13, 1946, 67 y.o.   MRN: [id number]  HPI PCP- fred Andrey Campanile  67 year old never smoker referred for evaluation of dyspnea and wheezing. She reports constant wheezing for the past year or 2. She reports dyspnea which is not necessarily related to exertion but worse for the past 3 months. She reports nonspecific, nonradiating substernal chest pain which is not worsened with exertion. She also reports symptoms of anxiety and chronic depression for many years. 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  She reports a dry cough for the past 4 weeks. She lives with her husband and daughter both of whom smoke. She saw a provider at her PCPs office and was given Levaquin and albuterol and referred to [place] & cardiology. She does admit to some hoarseness, but denies postnasal drip or reflux symptoms. She has never been intubated. Spirometry showed moderate restriction with FEV1 of 1.39-60% and FVC of 1.94-65% with ratio of 72. There was no truncating of the flow-volume loops She has undergone extensive neurological evaluation in the past for tremors   Past Medical History  Diagnosis Date  . Depression   . Anxiety   . GERD (gastroesophageal reflux disease)     Past Surgical History  Procedure Laterality Date  . Cholecystectomy    . Abdominal hysterectomy    . Prolapsed uterine fibroid ligation    . Esophagogastroduodenoscopy  08/03/2007    [id number] benign tubular peptic stricture status post dilation/   Hiatal hernia, erosive/ulcerative esophagitis  . Colonoscopy  08/03/2007    RMR: diminutive distal rectal polyps (2), Left-sided transverse diverticula HYPERPLASTIC POLYPS  . Rectocele repair    . Colonoscopy  02/17/2012    [id number] hemorrhoids; probable rectal prolapse-s/p  rectal mucosal biopsy. Colonic polyp-biopsied/Pancolonic diverticulosis. Benign polyp.   Marland Kitchen Appendectomy      Allergies  Allergen Reactions  .  Sulfonamide Derivatives     REACTION: Rash    History   Social History  . Marital Status: Married    Spouse Name: N/A    Number of Children: 4  . Years of Education: N/A   Occupational History  . retired    Social History Main Topics  . Smoking status: Never Smoker   . Smokeless tobacco: Not on file  . Alcohol Use: No  . Drug Use: No  . Sexual Activity: Not on file   Other Topics Concern  . Not on file   Social History Narrative  . No narrative on file    Family History  Problem Relation Age of Onset  . Colon cancer Neg Hx   . Transient ischemic attack Mother   . Lung cancer Father       Review of Systems  Constitutional: Negative for fever and unexpected weight change.  HENT: Positive for congestion, sneezing and sore throat. Negative for dental problem, ear pain, nosebleeds, postnasal drip, rhinorrhea, sinus pressure and trouble swallowing.   Eyes: Negative for redness and itching.  Respiratory: Positive for cough, chest tightness, shortness of breath and wheezing.   Cardiovascular: Positive for chest pain. Negative for palpitations and leg swelling.  Gastrointestinal: Negative for nausea and vomiting.  Genitourinary: Negative for dysuria.  Musculoskeletal: Negative for joint swelling.  Skin: Negative for rash.  Neurological: Positive for headaches.  Hematological: Does not bruise/bleed easily.  Psychiatric/Behavioral: Positive for dysphoric mood. The patient is nervous/anxious.        Objective:   Physical Exam  Gen.  Pleasant, well-nourished, in no distress, normal affect ENT - no lesions, no post nasal drip Neck: No JVD, no thyromegaly, no carotid bruits Lungs: no use of accessory muscles, no dullness to percussion, clear without rales or rhonchi , upper airway pseudowheeze Cardiovascular: Rhythm regular, heart sounds  normal, no murmurs or gallops, no peripheral edema Abdomen: soft and non-tender, no hepatosplenomegaly, BS normal. Musculoskeletal:  No deformities, no cyanosis or clubbing Neuro:  alert, non focal       Assessment & Plan:

## 2013-02-03 NOTE — Assessment & Plan Note (Signed)
Your breathing test does not show asthma ENT evaluation for hoarseness, if vocal cord anatomy is normal this may be consistent with vocal cord dysfunction rather than true asthma. Use albuterol MDI 2 puffs every 6h as needed

## 2013-02-03 NOTE — Patient Instructions (Signed)
Your breathing test does not show asthma ENT evaluation for hoarseness Use albuterol MDI 2 puffs every 6h as needed

## 2013-02-07 ENCOUNTER — Ambulatory Visit: Payer: Medicare Other | Admitting: Cardiology

## 2013-02-07 ENCOUNTER — Ambulatory Visit (HOSPITAL_COMMUNITY)
Admission: RE | Admit: 2013-02-07 | Discharge: 2013-02-07 | Disposition: A | Discharge status: Home / Self Care | Payer: Medicare Other | Source: Ambulatory Visit | Attending: Family Medicine | Admitting: Family Medicine

## 2013-02-07 DIAGNOSIS — Z139 Encounter for screening, unspecified: Secondary | ICD-10-CM

## 2013-02-07 DIAGNOSIS — Z1231 Encounter for screening mammogram for malignant neoplasm of breast: Secondary | ICD-10-CM | POA: Insufficient documentation

## 2013-03-24 ENCOUNTER — Telehealth: Payer: Self-pay | Admitting: Pulmonary Disease

## 2013-03-24 NOTE — Telephone Encounter (Signed)
ENt eval nml

## 2013-05-04 ENCOUNTER — Ambulatory Visit: Payer: Medicare Other | Admitting: Adult Health

## 2014-01-12 ENCOUNTER — Other Ambulatory Visit (HOSPITAL_COMMUNITY): Payer: Self-pay | Admitting: Family Medicine

## 2014-01-12 DIAGNOSIS — Z1231 Encounter for screening mammogram for malignant neoplasm of breast: Secondary | ICD-10-CM

## 2014-02-09 ENCOUNTER — Ambulatory Visit (HOSPITAL_COMMUNITY)
Admission: RE | Admit: 2014-02-09 | Discharge: 2014-02-09 | Disposition: A | Discharge status: Home / Self Care | Payer: Medicare Other | Source: Ambulatory Visit | Attending: Family Medicine | Admitting: Family Medicine

## 2014-02-09 DIAGNOSIS — Z1231 Encounter for screening mammogram for malignant neoplasm of breast: Secondary | ICD-10-CM | POA: Diagnosis present

## 2014-03-26 ENCOUNTER — Ambulatory Visit: Payer: Self-pay | Admitting: Neurology

## 2014-04-03 ENCOUNTER — Ambulatory Visit (INDEPENDENT_AMBULATORY_CARE_PROVIDER_SITE_OTHER): Payer: Medicare Other | Admitting: Neurology

## 2014-04-03 ENCOUNTER — Encounter: Payer: Self-pay | Admitting: Neurology

## 2014-04-03 VITALS — BP 130/65 | HR 64 | Ht 64.5 in | Wt 214.0 lb

## 2014-04-03 DIAGNOSIS — R5382 Chronic fatigue, unspecified: Secondary | ICD-10-CM

## 2014-04-03 DIAGNOSIS — R251 Tremor, unspecified: Secondary | ICD-10-CM

## 2014-04-03 DIAGNOSIS — E538 Deficiency of other specified B group vitamins: Secondary | ICD-10-CM

## 2014-04-03 DIAGNOSIS — R413 Other amnesia: Secondary | ICD-10-CM

## 2014-04-03 NOTE — Patient Instructions (Signed)
Overall you are doing fairly well but I do want to suggest a few things today:   Remember to drink plenty of fluid, eat healthy meals and do not skip any meals. Try to eat protein with a every meal and eat a healthy snack such as fruit or nuts in between meals. Try to keep a regular sleep-wake schedule and try to exercise daily, particularly in the form of walking, 20-30 minutes a day, if you can.   As far as your medications are concerned: Consider decreasing or changing Wellbutrin due to tremor, will forward recommendations to prescribing physician  As far as diagnostic testing: MRI of the brain, Labwork  I would like to see you back in 4-6 months, sooner if we need to. Please call us with any interim questions, concerns, problems, updates or refill requests.   Please also call us for any test results so we can go over those with you on the phone.  My clinical assistant and will answer any of your questions and relay your messages to me and also relay most of my messages to you.   Our phone number is [phone]. We also have an after hours call service for urgent matters and there is a physician on-call for urgent questions. For any emergencies you know to call 911 or go to the nearest emergency room

## 2014-04-03 NOTE — Progress Notes (Addendum)
[facility]    Provider:  Dr Jaynee Eagles Referring Provider: Christain Sacramento, MD Primary Care Physician:  Woody Seller, MD  CC:  Memory loss  HPI:  Elaine Alvarez is a 69 y.o. female here as a referral from Dr. Redmond Pulling for memory loss/cognitive changes. Here with daughter who provides most information however patient is conversant and pleasant and appears to be a good historian. Memory changes have been gradual. Daughter provides most of the information. For the last 5 years. Daughter will have to repeat things, patient gets upset having to be told things multiple times. Not really getting worse, it has been stable. There have been some discreet events pf noticeable worsening such as she had a bad injury in 2002 she fell down the steps and busted her head. Her depression has gotten worse, daughter says she isn't the same person. Moods and agitation and anxiety is worse. She can remember things that happened 10 years ago but recent memory is less clear like conversations a week ago. Patient has another daughter and she yells at patient and upsets her because daughter gets frustrated. Patient went to a neurologist 4 years ago and had an EEG, they saw some age-related things in the brain but nothing significant per patient and daughetr. She cries more. Emotional and mood changes are worsening. She is taking increasingly more psychiatric medications to try and help. She used to have staring spells and unresponsiveness which resolved. EEG in past(2008 and 2012) normal. 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 Mom had tremors. Tremors got worse with wellbutrin. Lots of stress in life. Husband with chronic conditions.   Reviewed notes, labs and imaging from outside physicians, which showed:   Ct showed: No acute intracranial abnormalities including mass lesion or mass effect, hydrocephalus, extra-axial fluid collection, midline shift, hemorrhage, or acute infarction, large ischemic  events (personally reviewed images). There is non-specific white-matter changes and generalized moderate atrophy.   EEG (Dr [facility]) 01/16/2011: report states "normal recording in the awake and sleep states"    Review of Systems: Patient complains of symptoms per HPI as well as the following symptoms: fatigue, SOB, wheezing, joint pain, cramps, memory loss, confusion, depression, anxiety, disinterest in activities. Pertinent negatives per HPI. All others negative.   History   Social History  . Marital Status: Married    Spouse Name: R    Number of Children: 4  . Years of Education: College 2y   Occupational History  . retired    Social History Main Topics  . Smoking status: Never Smoker   . Smokeless tobacco: Not on file  . Alcohol Use: No  . Drug Use: No  . Sexual Activity: Not on file   Other Topics Concern  . Not on file   Social History Narrative   Lives at home with husband (R) and daughter [name])   She is retired.   Has 4 children.    Caffeine use: Large cup of coffee/day and 4 sodas    Family History  Problem Relation Age of Onset  . Colon cancer Neg Hx   . Dementia Neg Hx   . Transient ischemic attack Mother   . Lung cancer Father     Past Medical History  Diagnosis Date  . Depression   . Anxiety   . GERD (gastroesophageal reflux disease)     Past Surgical History  Procedure Laterality Date  . Cholecystectomy  1974  . Abdominal hysterectomy  1984  . Prolapsed  uterine fibroid ligation  2010  . 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 Colonoscopy  08/03/2007    RMR: diminutive distal rectal polyps (2), Left-sided transverse diverticula HYPERPLASTIC POLYPS  . Rectocele repair    . Colonoscopy  02/17/2012    EQA:[id number] hemorrhoids; probable rectal prolapse-s/p  rectal mucosal biopsy. Colonic  polyp-biopsied/Pancolonic diverticulosis. Benign polyp.   Marland Kitchen Appendectomy    . Hemorrhoid surgery  2014    Current Outpatient Prescriptions  Medication Sig Dispense Refill  . buPROPion (WELLBUTRIN XL) 300 MG 24 hr tablet Take 300 mg by mouth every morning.  0  . clonazePAM (KLONOPIN) 1 MG tablet Take 1 mg by mouth 2 (two) times daily as needed. Anxiety.    Marland Kitchen escitalopram (LEXAPRO) 10 MG tablet Take 20 mg by mouth daily.     Marland Kitchen esomeprazole (NEXIUM) 20 MG capsule Take 22.3 mg by mouth daily at 12 noon.    . multivitamin-iron-minerals-folic acid (CENTRUM) chewable tablet Chew 1 tablet by mouth daily.    . primidone (MYSOLINE) 250 MG tablet Take 250 mg by mouth daily.  1   No current facility-administered medications for this visit.    Allergies as of 04/03/2014 - Review Complete 04/03/2014  Allergen Reaction Noted  . Sulfonamide derivatives      Vitals: BP 130/65 mmHg  Pulse 64  Ht 5' 4.5" (1.638 m)  Wt 214 lb (97.07 kg)  BMI 36.18 kg/m2 Last Weight:  Wt Readings from Last 1 Encounters:  04/03/14 214 lb (97.07 kg)   Last Height:   Ht Readings from Last 1 Encounters:  04/03/14 5' 4.5" (1.638 m)   Physical exam: Exam: Gen: NAD, conversant, well nourised, obese, poorly groomed, poor dentition                CV: RRR, no MRG. No Carotid Bruits. Eyes: Conjunctivae clear without exudates or hemorrhage  Neuro: Detailed Neurologic Exam  Speech:    Speech is normal; fluent and spontaneous with normal comprehension.  Cognition: MoCA 24/30 (-1 copy cube, -1 attention, -1 language, -2 delayed recall, -1 orientation)    The patient is oriented to person, place, and time;     recent and remote memory largely intact;     language fluent    normal attention, concentration during clinical exam however lost points on MoCA for tapping on list of letter    fund of knowledge again appears intact Cranial Nerves:    The pupils are equal, round, and reactive to light. The fundi are not  visualized due to small pupils. Visual fields are full to finger confrontation. Extraocular movements are intact. Trigeminal sensation is intact and the muscles of mastication are normal. The face is slightly symmetric due to right-sided facial/orbital previous trauma. The palate elevates in the midline. Hearing intact. Voice is normal. Shoulder shrug is normal. The tongue has normal motion without fasciculations.   Coordination:    Normal finger to nose and heel to shin. Normal rapid alternating movements.   Gait:    Can heel/toe with some difficulty in balance  Motor Observation: No-no head tremor High frequency resting and postural and intention tremor   Tone:    Normal muscle tone.    Posture:    Posture is normal. normal erect    Strength:    Strength is V/V in the upper and lower limbs.      Sensation: intact to LT  Reflex Exam:  DTR's:    Deep tendon reflexes in the upper and lower extremities are brisk bilaterally.   Toes:    The toes are downgoing bilaterally.   Clonus:    Clonus is absent.        Assessment/Plan:  69 year old female with reported memory loss. Etiology likely multifactorial including normal cognition changes of aging with a large contribution from depression and anxiety.   Cognitive changes:  MRi of the brain Labs including B12 and TSH Consider neuropsych testing  Tremor: Consider trying to decrease or change Wellbutrn  Sarina Ill, MD  [facility] [address] [place], [place] [address]  Phone [phone] Fax [phone]

## 2014-04-05 LAB — METHYLMALONIC ACID, SERUM: METHYLMALONIC ACID: 619 nmol/L — AB (ref 0–378)

## 2014-04-05 LAB — B12 AND FOLATE PANEL: VITAMIN B 12: 495 pg/mL (ref 211–946)

## 2014-04-05 LAB — VITAMIN B1, WHOLE BLOOD: Thiamine: 167.8 nmol/L (ref 66.5–200.0)

## 2014-04-05 LAB — RPR: RPR Ser Ql: NONREACTIVE

## 2014-04-05 LAB — TSH: TSH: 1.12 u[IU]/mL (ref 0.450–4.500)

## 2014-04-05 LAB — HIV ANTIBODY (ROUTINE TESTING W REFLEX): HIV Screen 4th Generation wRfx: NONREACTIVE

## 2014-04-10 ENCOUNTER — Telehealth: Payer: Self-pay | Admitting: *Deleted

## 2014-04-10 NOTE — Telephone Encounter (Signed)
Tried calling patient again and got a busy signal. Tried the home phone again and talked with her husband. Told her husband to have her give [place] a call back.

## 2014-04-10 NOTE — Telephone Encounter (Signed)
-----   Message from Melvenia Beam, MD sent at 04/09/2014  4:26 PM EST ----- Let patient know her labs were unremarkable. Her MMA was a little elevated but her b12 was normal so I am not too concerned with this. Still, I recommend she takes a daily multivitamin and a daily B12 supplement 1072mcg daily. Thank you.

## 2014-04-10 NOTE — Telephone Encounter (Signed)
Patient returning Terrence Dupont, RN's call.

## 2014-04-10 NOTE — Telephone Encounter (Signed)
-----   Message from Melvenia Beam, MD sent at 04/09/2014  4:26 PM EST ----- Let patient know her labs were unremarkable. Her MMA was a little elevated but her b12 was normal so I am not too concerned with this. Still, I recommend she takes a daily multivitamin and a daily B12 supplement 1069mcg daily. Thank you.

## 2014-04-10 NOTE — Telephone Encounter (Signed)
-----   Message from Melvenia Beam, MD sent at 04/09/2014  4:26 PM EST ----- Let patient know her labs were unremarkable. Her MMA was a little elevated but her b12 was normal so I am not too concerned with this. Still, I recommend she takes a daily multivitamin and a daily B12 supplement 1063mcg daily. Thank you.

## 2014-04-10 NOTE — Telephone Encounter (Signed)
Tried calling patient on home phone. Left a message for her to call us back.

## 2014-04-10 NOTE — Telephone Encounter (Signed)
Tried calling patient. Got a busy signal on mobile.

## 2014-04-11 NOTE — Telephone Encounter (Signed)
Patient returning Elaine Dupont, RN's call.  Please return call at home # [phone] and if its later this afternoon, please try cell # [phone].

## 2014-04-12 NOTE — Telephone Encounter (Signed)
Talked with patient about elevated MMA levels and normal B12 levels per Dr. Cathren Laine instructions. Patient verbalized understanding. Patient also stated she has her MRI scheduled for 04/14/13.

## 2014-04-21 ENCOUNTER — Inpatient Hospital Stay: Admission: RE | Admit: 2014-04-21 | Payer: Medicare Other | Source: Ambulatory Visit

## 2014-04-26 ENCOUNTER — Ambulatory Visit
Admission: RE | Admit: 2014-04-26 | Discharge: 2014-04-26 | Disposition: A | Discharge status: Home / Self Care | Payer: Medicare Other | Source: Ambulatory Visit | Attending: Neurology | Admitting: Neurology

## 2014-04-26 DIAGNOSIS — R413 Other amnesia: Secondary | ICD-10-CM | POA: Diagnosis not present

## 2014-05-01 ENCOUNTER — Telehealth: Payer: Self-pay | Admitting: Neurology

## 2014-05-01 NOTE — Telephone Encounter (Signed)
69 year old lovely female with reported memory loss. Etiology likely multifactorial including normal cognition changes of aging with a large contribution from depression and anxiety. Spoke to patient regarding the MRI of her brain that showed some mild atrophy and mild small-vessel ischemic changes. She reports she has been very depressed lately. I did speak with her and suggested following with her pcp or psychiatry for her depression as I suspect it is contributing to her perceived memory changes. Also recommended stroke ppx and following closely with pcp to reduce vascular risk factors. Thanks.

## 2014-12-24 ENCOUNTER — Encounter (HOSPITAL_COMMUNITY): Payer: Self-pay | Admitting: Emergency Medicine

## 2014-12-24 ENCOUNTER — Emergency Department (HOSPITAL_COMMUNITY): Payer: Medicare Other

## 2014-12-24 ENCOUNTER — Emergency Department (HOSPITAL_COMMUNITY)
Admission: EM | Admit: 2014-12-24 | Discharge: 2014-12-24 | Disposition: A | Discharge status: Home / Self Care | Payer: Medicare Other | Attending: Emergency Medicine | Admitting: Emergency Medicine

## 2014-12-24 DIAGNOSIS — Y9289 Other specified places as the place of occurrence of the external cause: Secondary | ICD-10-CM | POA: Insufficient documentation

## 2014-12-24 DIAGNOSIS — S838X1A Sprain of other specified parts of right knee, initial encounter: Secondary | ICD-10-CM | POA: Diagnosis not present

## 2014-12-24 DIAGNOSIS — Z79899 Other long term (current) drug therapy: Secondary | ICD-10-CM | POA: Diagnosis not present

## 2014-12-24 DIAGNOSIS — Y998 Other external cause status: Secondary | ICD-10-CM | POA: Diagnosis not present

## 2014-12-24 DIAGNOSIS — Y9389 Activity, other specified: Secondary | ICD-10-CM | POA: Diagnosis not present

## 2014-12-24 DIAGNOSIS — K219 Gastro-esophageal reflux disease without esophagitis: Secondary | ICD-10-CM | POA: Insufficient documentation

## 2014-12-24 DIAGNOSIS — W228XXA Striking against or struck by other objects, initial encounter: Secondary | ICD-10-CM | POA: Insufficient documentation

## 2014-12-24 DIAGNOSIS — F329 Major depressive disorder, single episode, unspecified: Secondary | ICD-10-CM | POA: Diagnosis not present

## 2014-12-24 DIAGNOSIS — F419 Anxiety disorder, unspecified: Secondary | ICD-10-CM | POA: Insufficient documentation

## 2014-12-24 DIAGNOSIS — S8991XA Unspecified injury of right lower leg, initial encounter: Secondary | ICD-10-CM | POA: Diagnosis present

## 2014-12-24 DIAGNOSIS — S86911A Strain of unspecified muscle(s) and tendon(s) at lower leg level, right leg, initial encounter: Secondary | ICD-10-CM

## 2014-12-24 MED ORDER — HYDROCODONE-ACETAMINOPHEN 5-325 MG PO TABS: ORAL_TABLET | ORAL | Status: DC

## 2014-12-24 MED ORDER — NAPROXEN 500 MG PO TABS: 500.0000 mg | ORAL_TABLET | Freq: Two times a day (BID) | ORAL | Status: DC

## 2014-12-24 NOTE — ED Notes (Signed)
Injury to right knee on Saturday waking up stairs, hitting right knee on trailer.  Rates pain 8/10.

## 2014-12-24 NOTE — ED Provider Notes (Signed)
CSN: [id number]     Arrival date & time 12/24/14  1212 History  By signing my name below, I, Elaine Alvarez, attest that this documentation has been prepared under the direction and in the presence of Kem Parkinson, PA-C  Electronically Signed: Tula Alvarez, ED Scribe. 12/24/2014. 12:59 PM.   Chief Complaint  Patient presents with  . Knee Injury    right   Patient is a 69 y.o. female presenting with knee pain. The history is provided by the patient. No language interpreter was used.  Knee Pain Location:  Knee Time since incident:  1 day Injury: yes   Knee location:  R knee Pain details:    Quality:  Aching   Severity:  Moderate   Onset quality:  Gradual   Duration:  1 day   Timing:  Constant   Progression:  Unchanged Chronicity:  New Relieved by:  Nothing Ineffective treatments:  Ice, heat and compression Associated symptoms: no numbness     HPI Comments: Elaine Alvarez is a 69 y.o. female who presents to the Emergency Department complaining of constant, moderate right knee pain that started 1 day ago. Pt reports swelling of her knee as associated symptom. She has tried applying heat, ice and a OTC knee brace with no relief. Pt also took 2 Ibuprofen this morning with no improvement to her pain. Pt reports that onset of pain started after she hit her knee on a piece of metal while stepping onto a hay wagon during a "hay ride". She has a history of right leg pain, but states this pain is new for her. Pt denies hitting her head or LOC. She also denies numbness, weakness, redness as an associated symptoms.   Past Medical History  Diagnosis Date  . Depression   . Anxiety   . GERD (gastroesophageal reflux disease)    Past Surgical History  Procedure Laterality Date  . Cholecystectomy  1974  . Abdominal hysterectomy  1984  . Prolapsed uterine fibroid ligation  2010  . Esophagogastroduodenoscopy  08/03/2007    TFT:DDUKG benign tubular peptic stricture status post dilation/    Hiatal hernia, erosive/ulcerative esophagitis  . Colonoscopy  08/03/2007    RMR: diminutive distal rectal polyps (2), Left-sided transverse diverticula HYPERPLASTIC POLYPS  . Rectocele repair    . Colonoscopy  02/17/2012    URK:YHCWCBJS hemorrhoids; probable rectal prolapse-s/p  rectal mucosal biopsy. Colonic polyp-biopsied/Pancolonic diverticulosis. Benign polyp.   Marland Kitchen Appendectomy    . Hemorrhoid surgery  2014   Family History  Problem Relation Age of Onset  . Colon cancer Neg Hx   . Dementia Neg Hx   . Transient ischemic attack Mother   . Lung cancer Father    Social History  Substance Use Topics  . Smoking status: Never Smoker   . Smokeless tobacco: None  . Alcohol Use: No   OB History    No data available     Review of Systems  Musculoskeletal: Positive for joint swelling and arthralgias.  Skin: Negative for wound.  Neurological: Negative for numbness.  All other systems reviewed and are negative.  Allergies  Sulfonamide derivatives  Home Medications   Prior to Admission medications   Medication Sig Start Date End Date Taking? Authorizing Provider  buPROPion (WELLBUTRIN XL) 300 MG 24 hr tablet Take 300 mg by mouth every morning. 02/20/14   Historical Provider, MD  clonazePAM (KLONOPIN) 1 MG tablet Take 1 mg by mouth 2 (two) times daily as needed. Anxiety. 01/11/12   Historical Provider,  MD  escitalopram (LEXAPRO) 10 MG tablet Take 20 mg by mouth daily.  09/17/12   Historical Provider, MD  esomeprazole (NEXIUM) 20 MG capsule Take 22.3 mg by mouth daily at 12 noon.    Historical Provider, MD  multivitamin-iron-minerals-folic acid (CENTRUM) chewable tablet Chew 1 tablet by mouth daily.    Historical Provider, MD  primidone (MYSOLINE) 250 MG tablet Take 250 mg by mouth daily. 03/19/14   Historical Provider, MD   BP 139/82 mmHg  Pulse 79  Temp(Src) 97.5 F (36.4 C) (Oral)  Resp 20  Ht 5\' 3"  (1.6 m)  Wt 218 lb (98.884 kg)  BMI 38.63 kg/m2  SpO2 97% Physical Exam   Constitutional: She appears well-developed and well-nourished. No distress.  HENT:  Head: Normocephalic and atraumatic.  Eyes: Conjunctivae and EOM are normal.  Neck: Neck supple. No tracheal deviation present.  Cardiovascular: Normal rate, regular rhythm and normal heart sounds.   Pulmonary/Chest: Effort normal and breath sounds normal. No respiratory distress. She has no wheezes.  Musculoskeletal: She exhibits tenderness.  Diffuse tenderness to the right anterior knee without obvious effusion No erythema, step-offs or deformity  Skin: Skin is warm and dry.  Psychiatric: She has a normal mood and affect. Her behavior is normal.  Nursing note and vitals reviewed.   ED Course  Procedures  DIAGNOSTIC STUDIES: Oxygen Saturation is 97% on RA, normal by my interpretation.    COORDINATION OF CARE: 12:59 PM Discussed treatment plan with pt which includes ice and pain management. Pt agreed to plan.   Imaging Review Dg Knee Complete 4 Views Right  12/24/2014  CLINICAL DATA:  Injury Saturday night, hit leg on a steel bar EXAM: RIGHT KNEE - COMPLETE 4+ VIEW COMPARISON:  None. FINDINGS: Four views of the right knee submitted. No acute fracture or subluxation. There is prepatellar soft tissue swelling. Tiny joint effusion. Narrowing of medial joint compartment. Chondrocalcinosis is noted medial lateral compartment. Spurring of medial femoral condyle. Narrowing of patellofemoral joint space. IMPRESSION: No acute fracture or subluxation. Prepatellar soft tissue swelling. Small joint effusion. Degenerative changes as described above. Electronically Signed   By: Lahoma Crocker M.D.   On: 12/24/2014 12:57   I have personally reviewed and evaluated these images as part of my medical decision-making.    MDM   Final diagnoses:  Knee strain, right, initial encounter    Pt is well appearing, vitals stable.  No concerning sx's for septic joint.  Likely strain, but possible ligamentous injury was  discussed.  Pt advised to arrange close f/u with orthopedics if sx's are not improving.    Knee immobilizer applied, pt has cane at home.  Agrees to plan.  Stable for d/c  I personally performed the services described in this documentation, which was scribed in my presence. The recorded information has been reviewed and is accurate.    Kem Parkinson, PA-C 12/26/14 1626  Elnora Morrison, MD 12/28/14 [phone]

## 2015-01-07 ENCOUNTER — Other Ambulatory Visit (HOSPITAL_COMMUNITY): Payer: Self-pay | Admitting: Family Medicine

## 2015-01-07 DIAGNOSIS — Z1231 Encounter for screening mammogram for malignant neoplasm of breast: Secondary | ICD-10-CM

## 2015-02-11 ENCOUNTER — Ambulatory Visit (HOSPITAL_COMMUNITY): Payer: Medicare Other

## 2015-04-08 ENCOUNTER — Ambulatory Visit (HOSPITAL_COMMUNITY): Payer: Medicare Other

## 2015-06-25 ENCOUNTER — Ambulatory Visit: Payer: Medicare Other | Admitting: Internal Medicine

## 2015-08-20 ENCOUNTER — Encounter: Payer: Self-pay | Admitting: Internal Medicine

## 2015-08-20 ENCOUNTER — Ambulatory Visit: Payer: Medicare Other | Admitting: Internal Medicine

## 2015-10-24 ENCOUNTER — Ambulatory Visit: Payer: Self-pay | Admitting: Orthopedic Surgery

## 2015-11-14 ENCOUNTER — Inpatient Hospital Stay (HOSPITAL_COMMUNITY): Admission: RE | Admit: 2015-11-14 | Payer: Medicare Other | Source: Ambulatory Visit

## 2015-11-25 ENCOUNTER — Encounter: Admission: RE | Payer: Self-pay | Source: Ambulatory Visit

## 2015-11-25 ENCOUNTER — Inpatient Hospital Stay: Admission: RE | Admit: 2015-11-25 | Payer: Medicare Other | Source: Ambulatory Visit | Admitting: Orthopedic Surgery

## 2015-11-25 SURGERY — ARTHROPLASTY, KNEE, TOTAL, USING IMAGELESS COMPUTER-ASSISTED NAVIGATION
Anesthesia: Spinal | Laterality: Right

## 2016-12-29 ENCOUNTER — Other Ambulatory Visit: Payer: Self-pay | Admitting: Family Medicine

## 2016-12-29 ENCOUNTER — Ambulatory Visit
Admission: RE | Admit: 2016-12-29 | Discharge: 2016-12-29 | Disposition: A | Discharge status: Home / Self Care | Payer: Medicare Other | Source: Ambulatory Visit | Attending: Family Medicine | Admitting: Family Medicine

## 2016-12-29 DIAGNOSIS — R0609 Other forms of dyspnea: Principal | ICD-10-CM

## 2016-12-30 ENCOUNTER — Other Ambulatory Visit: Payer: Self-pay | Admitting: Family Medicine

## 2016-12-30 DIAGNOSIS — R7989 Other specified abnormal findings of blood chemistry: Secondary | ICD-10-CM

## 2016-12-30 DIAGNOSIS — R0602 Shortness of breath: Secondary | ICD-10-CM

## 2016-12-31 ENCOUNTER — Ambulatory Visit
Admission: RE | Admit: 2016-12-31 | Discharge: 2016-12-31 | Disposition: A | Discharge status: Home / Self Care | Payer: Medicare Other | Source: Ambulatory Visit | Attending: Family Medicine | Admitting: Family Medicine

## 2016-12-31 DIAGNOSIS — R7989 Other specified abnormal findings of blood chemistry: Secondary | ICD-10-CM

## 2016-12-31 DIAGNOSIS — R0602 Shortness of breath: Secondary | ICD-10-CM

## 2016-12-31 MED ORDER — IOPAMIDOL (ISOVUE-370) INJECTION 76%
75.0000 mL | Freq: Once | INTRAVENOUS | Status: AC | PRN
  Administered 2016-12-31: 75 mL via INTRAVENOUS

## 2017-01-11 ENCOUNTER — Encounter: Payer: Self-pay | Admitting: Cardiology

## 2017-01-11 NOTE — H&P (View-Only) (Signed)
Cardiology Office Note  Date: 01/12/2017   ID: Elaine Alvarez, Elaine Alvarez 1945/12/07, MRN [id number]  PCP: Nickola Major, MD  Consulting cardiologist: Rozann Lesches, MD   Chief Complaint  Patient presents with  . Dyspnea on exertion    History of Present Illness: Elaine Alvarez is a 71 y.o. female referred for cardiology consultation by Dr.Eksir for the evaluation of dyspnea on exertion.  We discussed her symptoms.  She tells me that approximately 3 weeks ago she woke up at nighttime suddenly short of breath and experiencing chest discomfort.  She states that the chest discomfort ultimately resolved, but since that time she has had shortness of breath with typical activities, out of proportion to what she would expect to experience.  She reports NYHA class III shortness of breath.  She has not had recurrent chest pain but feels very fatigued.  I reviewed her recent lab work as well as chest CTA noted below.  She did have coronary artery calcifications documented.  She does not report any personal history of hypertension, hyperlipidemia, or type 2 diabetes mellitus.  Recent ECG showed no acute changes.  States that she is very frustrated by her shortness of breath, has had difficulty completing typical chores during the daytime.  She has not undergone any prior cardiac ischemic testing.  Past Medical History:  Diagnosis Date  . Anxiety   . Depression   . GERD (gastroesophageal reflux disease)   . Irritable bowel syndrome   . Osteoarthritis     Past Surgical History:  Procedure Laterality Date  . ABDOMINAL HYSTERECTOMY  1984  . APPENDECTOMY    . CHOLECYSTECTOMY  1974  . COLONOSCOPY  08/03/2007   RMR: diminutive distal rectal polyps (2), Left-sided transverse diverticula HYPERPLASTIC POLYPS  . ESOPHAGOGASTRODUODENOSCOPY  08/03/2007   EML:JQGBE benign tubular peptic stricture status post dilation/   Hiatal hernia, erosive/ulcerative esophagitis  . HEMORRHOID SURGERY  2014   Dr.Waters  . PROLAPSED UTERINE FIBROID LIGATION  2010  . RECTOCELE REPAIR      Current Outpatient Medications  Medication Sig Dispense Refill  . clonazePAM (KLONOPIN) 1 MG tablet Take 1 mg by mouth 2 (two) times daily as needed. Anxiety.    . diclofenac sodium (VOLTAREN) 1 % GEL Apply 4 g of 1% gel to affected area (knee or spine) 4 times daily. Can use with lidocaine gel.    . esomeprazole (NEXIUM) 20 MG capsule Take 22.3 mg by mouth daily at 12 noon.    . multivitamin-iron-minerals-folic acid (CENTRUM) chewable tablet Chew 1 tablet by mouth daily.    . sertraline (ZOLOFT) 50 MG tablet TAKE 1 TABLET BY MOUTH DAILY AS DIRECTED FOR ANXIETY AND DEPRESSION.  3  . traZODone (DESYREL) 50 MG tablet Take 50 mg at bedtime by mouth.     No current facility-administered medications for this visit.    Allergies:  Sulfonamide derivatives   Social History: The patient  reports that  has never smoked. she has never used smokeless tobacco. She reports that she does not drink alcohol or use drugs.   Family History: The patient's family history includes Lung cancer in her father; Transient ischemic attack in her mother.   ROS:  Please see the history of present illness. Otherwise, complete review of systems is positive for osteoarthritis with chronic right knee pain.  All other systems are reviewed and negative.   Physical Exam: VS:  BP 130/82 (BP Location: Left Arm)   Pulse 88   Ht 5' (1.524 m)  Wt 218 lb (98.9 kg)   SpO2 94%   BMI 42.58 kg/m , BMI Body mass index is 42.58 kg/m.  Wt Readings from Last 3 Encounters:  01/12/17 218 lb (98.9 kg)  12/24/14 218 lb (98.9 kg)  04/03/14 214 lb (97.1 kg)    General: Obese woman, no distress. HEENT: Conjunctiva and lids normal, oropharynx clear with moist mucosa. Neck: Supple, no elevated JVP or carotid bruits, no thyromegaly. Lungs: Clear to auscultation, nonlabored breathing at rest. Cardiac: Regular rate and rhythm, no S3, soft systolic murmur,  no pericardial rub. Abdomen: Soft, nontender, bowel sounds present, no guarding or rebound. Extremities: Mild ankle edema, distal pulses 2+. Skin: Warm and dry. Musculoskeletal: No kyphosis. Neuropsychiatric: Alert and oriented x3, affect grossly appropriate.  ECG: I personally reviewed the tracing from 12/28/2016 which showed sinus bradycardia.  Recent Labwork:  October 2018: Potassium 4.8, BUN 23, creatinine 1.17, d-dimer 500 ng/mL (normal), BNP 32.9, hemoglobin 14.3, platelets 152  Other Studies Reviewed Today:  Chest CTA 01/19/2017: IMPRESSION: 1. No definite evidence of pulmonary emboli but technically less than optimal examination secondary to patient shortness of breath and sub optimal contrast opacification. 2. Mild bibasilar atelectasis 3. Coronary artery disease 4. Aortic Atherosclerosis (ICD10-I70.0).  Assessment and Plan:  1.  Progressive dyspnea on exertion, symptoms began 3 weeks ago with sudden onset of shortness of breath and chest discomfort.  ECG is nonspecific, she did have a chest CTA that demonstrated coronary artery calcifications.  Otherwise no evidence of pulmonary embolus and BNP level was within normal range.  She is very limited by her current symptoms.  We discussed further cardiac diagnostic testing options, and after reviewing the risks and benefits, plan is to proceed with a diagnostic cardiac catheterization.  2.  GERD, patient reports good symptom control on Nexium, no dysphasia.  States that current symptoms are not at all similar.  3.  Osteoarthritis with chronic right knee pain.  She states that she was scheduled to undergo right knee replacement a year ago but could not pursue this due to other family concerns.  She is considering having this reevaluated in the future.  Further cardiac evaluation now will also serve to assist with preoperative workup.  Current medicines were reviewed with the patient today.   Orders Placed This Encounter    Procedures  . CBC w/Diff/Platelet  . INR/PT  . Basic Metabolic Panel (BMET)    Disposition: Follow-up after cardiac catheterization.  Signed, Satira Sark, MD, [facility] 01/12/2017 11:33 AM    [facility] [place]. [address], [place], [place] [address] Phone: [phone]; Fax: [phone]

## 2017-01-11 NOTE — Progress Notes (Signed)
Cardiology Office Note  Date: 01/12/2017   ID: Aubryana, Vittorio 04/12/1945, MRN [id number]  PCP: Nickola Major, MD  Consulting cardiologist: Rozann Lesches, MD   Chief Complaint  Patient presents with  . Dyspnea on exertion    History of Present Illness: Elaine Alvarez is a 71 y.o. female referred for cardiology consultation by Dr.Eksir for the evaluation of dyspnea on exertion.  We discussed her symptoms.  She tells me that approximately 3 weeks ago she woke up at nighttime suddenly short of breath and experiencing chest discomfort.  She states that the chest discomfort ultimately resolved, but since that time she has had shortness of breath with typical activities, out of proportion to what she would expect to experience.  She reports NYHA class III shortness of breath.  She has not had recurrent chest pain but feels very fatigued.  I reviewed her recent lab work as well as chest CTA noted below.  She did have coronary artery calcifications documented.  She does not report any personal history of hypertension, hyperlipidemia, or type 2 diabetes mellitus.  Recent ECG showed no acute changes.  States that she is very frustrated by her shortness of breath, has had difficulty completing typical chores during the daytime.  She has not undergone any prior cardiac ischemic testing.  Past Medical History:  Diagnosis Date  . Anxiety   . Depression   . GERD (gastroesophageal reflux disease)   . Irritable bowel syndrome   . Osteoarthritis     Past Surgical History:  Procedure Laterality Date  . ABDOMINAL HYSTERECTOMY  1984  . APPENDECTOMY    . CHOLECYSTECTOMY  1974  . COLONOSCOPY  08/03/2007   RMR: diminutive distal rectal polyps (2), Left-sided transverse diverticula HYPERPLASTIC POLYPS  . ESOPHAGOGASTRODUODENOSCOPY  08/03/2007   WUJ:[id number] benign tubular peptic stricture status post dilation/   Hiatal hernia, erosive/ulcerative esophagitis  . HEMORRHOID SURGERY  2014   Dr.Waters  . PROLAPSED UTERINE FIBROID LIGATION  2010  . RECTOCELE REPAIR      Current Outpatient Medications  Medication Sig Dispense Refill  . clonazePAM (KLONOPIN) 1 MG tablet Take 1 mg by mouth 2 (two) times daily as needed. Anxiety.    . diclofenac sodium (VOLTAREN) 1 % GEL Apply 4 g of 1% gel to affected area (knee or spine) 4 times daily. Can use with lidocaine gel.    . esomeprazole (NEXIUM) 20 MG capsule Take 22.3 mg by mouth daily at 12 noon.    . multivitamin-iron-minerals-folic acid (CENTRUM) chewable tablet Chew 1 tablet by mouth daily.    . sertraline (ZOLOFT) 50 MG tablet TAKE 1 TABLET BY MOUTH DAILY AS DIRECTED FOR ANXIETY AND DEPRESSION.  3  . traZODone (DESYREL) 50 MG tablet Take 50 mg at bedtime by mouth.     No current facility-administered medications for this visit.    Allergies:  Sulfonamide derivatives   Social History: The patient  reports that  has never smoked. she has never used smokeless tobacco. She reports that she does not drink alcohol or use drugs.   Family History: The patient's family history includes Lung cancer in her father; Transient ischemic attack in her mother.   ROS:  Please see the history of present illness. Otherwise, complete review of systems is positive for osteoarthritis with chronic right knee pain.  All other systems are reviewed and negative.   Physical Exam: VS:  BP 130/82 (BP Location: Left Arm)   Pulse 88   Ht 5' (1.524 m)  Wt 218 lb (98.9 kg)   SpO2 94%   BMI 42.58 kg/m , BMI Body mass index is 42.58 kg/m.  Wt Readings from Last 3 Encounters:  01/12/17 218 lb (98.9 kg)  12/24/14 218 lb (98.9 kg)  04/03/14 214 lb (97.1 kg)    General: Obese woman, no distress. HEENT: Conjunctiva and lids normal, oropharynx clear with moist mucosa. Neck: Supple, no elevated JVP or carotid bruits, no thyromegaly. Lungs: Clear to auscultation, nonlabored breathing at rest. Cardiac: Regular rate and rhythm, no S3, soft systolic murmur,  no pericardial rub. Abdomen: Soft, nontender, bowel sounds present, no guarding or rebound. Extremities: Mild ankle edema, distal pulses 2+. Skin: Warm and dry. Musculoskeletal: No kyphosis. Neuropsychiatric: Alert and oriented x3, affect grossly appropriate.  ECG: I personally reviewed the tracing from 12/28/2016 which showed sinus bradycardia.  Recent Labwork:  October 2018: Potassium 4.8, BUN 23, creatinine 1.17, d-dimer 500 ng/mL (normal), BNP 32.9, hemoglobin 14.3, platelets 152  Other Studies Reviewed Today:  Chest CTA 01/19/2017: IMPRESSION: 1. No definite evidence of pulmonary emboli but technically less than optimal examination secondary to patient shortness of breath and sub optimal contrast opacification. 2. Mild bibasilar atelectasis 3. Coronary artery disease 4. Aortic Atherosclerosis (ICD10-I70.0).  Assessment and Plan:  1.  Progressive dyspnea on exertion, symptoms began 3 weeks ago with sudden onset of shortness of breath and chest discomfort.  ECG is nonspecific, she did have a chest CTA that demonstrated coronary artery calcifications.  Otherwise no evidence of pulmonary embolus and BNP level was within normal range.  She is very limited by her current symptoms.  We discussed further cardiac diagnostic testing options, and after reviewing the risks and benefits, plan is to proceed with a diagnostic cardiac catheterization.  2.  GERD, patient reports good symptom control on Nexium, no dysphasia.  States that current symptoms are not at all similar.  3.  Osteoarthritis with chronic right knee pain.  She states that she was scheduled to undergo right knee replacement a year ago but could not pursue this due to other family concerns.  She is considering having this reevaluated in the future.  Further cardiac evaluation now will also serve to assist with preoperative workup.  Current medicines were reviewed with the patient today.   Orders Placed This Encounter    Procedures  . CBC w/Diff/Platelet  . INR/PT  . Basic Metabolic Panel (BMET)    Disposition: Follow-up after cardiac catheterization.  Signed, Satira Sark, MD, [facility] 01/12/2017 11:33 AM    [facility] [place]. [address] [place], [place] [address] Phone: [phone]; Fax: [phone]

## 2017-01-12 ENCOUNTER — Other Ambulatory Visit (HOSPITAL_COMMUNITY)
Admission: RE | Admit: 2017-01-12 | Discharge: 2017-01-12 | Disposition: A | Discharge status: Home / Self Care | Payer: Medicare Other | Source: Ambulatory Visit | Attending: Cardiology | Admitting: Cardiology

## 2017-01-12 ENCOUNTER — Encounter: Payer: Self-pay | Admitting: Cardiology

## 2017-01-12 ENCOUNTER — Ambulatory Visit (INDEPENDENT_AMBULATORY_CARE_PROVIDER_SITE_OTHER): Payer: Medicare Other | Admitting: Cardiology

## 2017-01-12 ENCOUNTER — Other Ambulatory Visit: Payer: Self-pay | Admitting: Cardiology

## 2017-01-12 VITALS — BP 130/82 | HR 88 | Ht 60.0 in | Wt 218.0 lb

## 2017-01-12 DIAGNOSIS — I200 Unstable angina: Secondary | ICD-10-CM | POA: Diagnosis not present

## 2017-01-12 DIAGNOSIS — R0609 Other forms of dyspnea: Secondary | ICD-10-CM | POA: Insufficient documentation

## 2017-01-12 DIAGNOSIS — Z01810 Encounter for preprocedural cardiovascular examination: Secondary | ICD-10-CM

## 2017-01-12 DIAGNOSIS — Z01818 Encounter for other preprocedural examination: Secondary | ICD-10-CM

## 2017-01-12 DIAGNOSIS — K219 Gastro-esophageal reflux disease without esophagitis: Secondary | ICD-10-CM

## 2017-01-12 LAB — CBC WITH DIFFERENTIAL/PLATELET
BASOS ABS: 0 10*3/uL (ref 0.0–0.1)
BASOS PCT: 1 %
EOS ABS: 0.1 10*3/uL (ref 0.0–0.7)
EOS PCT: 2 %
HCT: 43.1 % (ref 36.0–46.0)
HEMOGLOBIN: 14 g/dL (ref 12.0–15.0)
Lymphocytes Relative: 19 %
Lymphs Abs: 1.1 10*3/uL (ref 0.7–4.0)
MCH: 30.6 pg (ref 26.0–34.0)
MCHC: 32.5 g/dL (ref 30.0–36.0)
MCV: 94.3 fL (ref 78.0–100.0)
Monocytes Absolute: 0.5 10*3/uL (ref 0.1–1.0)
Monocytes Relative: 8 %
NEUTROS PCT: 70 %
Neutro Abs: 4 10*3/uL (ref 1.7–7.7)
PLATELETS: 153 10*3/uL (ref 150–400)
RBC: 4.57 MIL/uL (ref 3.87–5.11)
RDW: 12.7 % (ref 11.5–15.5)
WBC: 5.7 10*3/uL (ref 4.0–10.5)

## 2017-01-12 LAB — BASIC METABOLIC PANEL
ANION GAP: 6 (ref 5–15)
BUN: 9 mg/dL (ref 6–20)
CHLORIDE: 101 mmol/L (ref 101–111)
CO2: 33 mmol/L — AB (ref 22–32)
Calcium: 9.5 mg/dL (ref 8.9–10.3)
Creatinine, Ser: 0.91 mg/dL (ref 0.44–1.00)
GFR calc non Af Amer: >60 mL/min (ref >60)
Glucose, Bld: 122 mg/dL — ABNORMAL HIGH (ref 65–99)
Potassium: 4.2 mmol/L (ref 3.5–5.1)
Sodium: 140 mmol/L (ref 135–145)

## 2017-01-12 LAB — PROTIME-INR
INR: 0.83
PROTHROMBIN TIME: 11.3 s — AB (ref 11.4–15.2)

## 2017-01-12 NOTE — Patient Instructions (Signed)
   [place] [address] Dept: [phone] Loc: [place]  01/12/2017  You are scheduled for a Cardiac Catheterization on Monday, November 19 with Dr. Larae Grooms.  1. Please arrive at the [facility] (Main Entrance A) at [facility]: [address] [place], [place] [address] at 5:30 AM (two hours before your procedure to ensure your preparation). Free valet parking service is available.   Special note: Every effort is made to have your procedure done on time. Please understand that emergencies sometimes delay scheduled procedures.  2. Diet: nothing to eat or drink after midnight before cath  3. Labs: get done today at [facility]  4. Medication instructions in preparation for your procedure: You may take your medications as usual    On the morning of your procedure, take your Aspirin 81 mg and any morning medicines NOT listed above.  You may use sips of water.  5. Plan for one night stay--bring personal belongings. 6. Bring a current list of your medications and current insurance cards. 7. You MUST have a responsible person to drive you home. 8. Someone MUST be with you the first 24 hours after you arrive home or your discharge will be delayed. 9. Please wear clothes that are easy to get on and off and wear slip-on shoes.  Thank you for allowing [place] to care for you!   -- [facility]

## 2017-01-14 ENCOUNTER — Telehealth: Payer: Self-pay

## 2017-01-14 NOTE — Telephone Encounter (Signed)
Attempted outreach to Pt.  No DPR on file.  Unable to leave message.

## 2017-01-18 ENCOUNTER — Encounter (HOSPITAL_COMMUNITY): Payer: Self-pay | Admitting: Interventional Cardiology

## 2017-01-18 ENCOUNTER — Ambulatory Visit (HOSPITAL_COMMUNITY)
Admission: RE | Admit: 2017-01-18 | Discharge: 2017-01-18 | Disposition: A | Discharge status: Home / Self Care | Payer: Medicare Other | Source: Ambulatory Visit | Attending: Interventional Cardiology | Admitting: Interventional Cardiology

## 2017-01-18 ENCOUNTER — Encounter (HOSPITAL_COMMUNITY)
Admission: RE | Disposition: A | Discharge status: Home / Self Care | Payer: Self-pay | Source: Ambulatory Visit | Attending: Interventional Cardiology

## 2017-01-18 DIAGNOSIS — I2584 Coronary atherosclerosis due to calcified coronary lesion: Secondary | ICD-10-CM

## 2017-01-18 DIAGNOSIS — I2510 Atherosclerotic heart disease of native coronary artery without angina pectoris: Secondary | ICD-10-CM | POA: Diagnosis not present

## 2017-01-18 DIAGNOSIS — K219 Gastro-esophageal reflux disease without esophagitis: Secondary | ICD-10-CM | POA: Insufficient documentation

## 2017-01-18 DIAGNOSIS — Z882 Allergy status to sulfonamides status: Secondary | ICD-10-CM | POA: Insufficient documentation

## 2017-01-18 DIAGNOSIS — F419 Anxiety disorder, unspecified: Secondary | ICD-10-CM | POA: Diagnosis not present

## 2017-01-18 DIAGNOSIS — G8929 Other chronic pain: Secondary | ICD-10-CM | POA: Insufficient documentation

## 2017-01-18 DIAGNOSIS — M1711 Unilateral primary osteoarthritis, right knee: Secondary | ICD-10-CM | POA: Diagnosis not present

## 2017-01-18 DIAGNOSIS — F329 Major depressive disorder, single episode, unspecified: Secondary | ICD-10-CM | POA: Diagnosis not present

## 2017-01-18 DIAGNOSIS — R0609 Other forms of dyspnea: Secondary | ICD-10-CM | POA: Insufficient documentation

## 2017-01-18 DIAGNOSIS — K589 Irritable bowel syndrome without diarrhea: Secondary | ICD-10-CM | POA: Insufficient documentation

## 2017-01-18 HISTORY — PX: LEFT HEART CATH AND CORONARY ANGIOGRAPHY: CATH118249

## 2017-01-18 SURGERY — LEFT HEART CATH AND CORONARY ANGIOGRAPHY
Anesthesia: LOCAL

## 2017-01-18 MED ORDER — SODIUM CHLORIDE 0.9% FLUSH
3.0000 mL | Freq: Two times a day (BID) | INTRAVENOUS | Status: DC
Start: 2017-01-18 — End: 2017-01-18

## 2017-01-18 MED ORDER — MIDAZOLAM HCL 2 MG/2ML IJ SOLN
INTRAMUSCULAR | Status: DC | PRN
  Administered 2017-01-18 (×2): 1 mg via INTRAVENOUS

## 2017-01-18 MED ORDER — ATORVASTATIN CALCIUM 80 MG PO TABS: 80.0000 mg | ORAL_TABLET | Freq: Every day | ORAL | 1 refills | Status: DC

## 2017-01-18 MED ORDER — HEPARIN (PORCINE) IN NACL 2-0.9 UNIT/ML-% IJ SOLN
INTRAMUSCULAR | Status: AC
Start: 2017-01-18 — End: 2017-01-18
  Filled 2017-01-18: qty 500

## 2017-01-18 MED ORDER — SODIUM CHLORIDE 0.9 % WEIGHT BASED INFUSION: 1.0000 mL/kg/h | INTRAVENOUS | Status: DC

## 2017-01-18 MED ORDER — MIDAZOLAM HCL 2 MG/2ML IJ SOLN
INTRAMUSCULAR | Status: AC
  Filled 2017-01-18: qty 2

## 2017-01-18 MED ORDER — SODIUM CHLORIDE 0.9 % WEIGHT BASED INFUSION
3.0000 mL/kg/h | INTRAVENOUS | Status: AC
  Administered 2017-01-18: 3 mL/kg/h via INTRAVENOUS

## 2017-01-18 MED ORDER — SODIUM CHLORIDE 0.9% FLUSH: 3.0000 mL | INTRAVENOUS | Status: DC | PRN

## 2017-01-18 MED ORDER — SODIUM CHLORIDE 0.9% FLUSH: 3.0000 mL | Freq: Two times a day (BID) | INTRAVENOUS | Status: DC

## 2017-01-18 MED ORDER — HEPARIN (PORCINE) IN NACL 2-0.9 UNIT/ML-% IJ SOLN
INTRAMUSCULAR | Status: AC | PRN
  Administered 2017-01-18: 1000 mL

## 2017-01-18 MED ORDER — ATORVASTATIN CALCIUM 80 MG PO TABS
80.0000 mg | ORAL_TABLET | Freq: Every day | ORAL | Status: DC
  Filled 2017-01-18: qty 1

## 2017-01-18 MED ORDER — VERAPAMIL HCL 2.5 MG/ML IV SOLN
INTRAVENOUS | Status: DC | PRN
  Administered 2017-01-18: 10 mL via INTRA_ARTERIAL

## 2017-01-18 MED ORDER — HEPARIN SODIUM (PORCINE) 1000 UNIT/ML IJ SOLN
INTRAMUSCULAR | Status: DC | PRN
  Administered 2017-01-18: 5000 [IU] via INTRAVENOUS

## 2017-01-18 MED ORDER — FENTANYL CITRATE (PF) 100 MCG/2ML IJ SOLN
INTRAMUSCULAR | Status: DC | PRN
  Administered 2017-01-18 (×2): 25 ug via INTRAVENOUS

## 2017-01-18 MED ORDER — SODIUM CHLORIDE 0.9 % IV SOLN: INTRAVENOUS | Status: AC

## 2017-01-18 MED ORDER — HEPARIN SODIUM (PORCINE) 1000 UNIT/ML IJ SOLN
INTRAMUSCULAR | Status: AC
  Filled 2017-01-18: qty 1

## 2017-01-18 MED ORDER — ASPIRIN 81 MG PO CHEW: 81.0000 mg | CHEWABLE_TABLET | ORAL | Status: DC

## 2017-01-18 MED ORDER — IOPAMIDOL (ISOVUE-370) INJECTION 76%
INTRAVENOUS | Status: AC
  Filled 2017-01-18: qty 100

## 2017-01-18 MED ORDER — VERAPAMIL HCL 2.5 MG/ML IV SOLN
INTRAVENOUS | Status: AC
  Filled 2017-01-18: qty 2

## 2017-01-18 MED ORDER — FENTANYL CITRATE (PF) 100 MCG/2ML IJ SOLN
INTRAMUSCULAR | Status: AC
  Filled 2017-01-18: qty 2

## 2017-01-18 MED ORDER — ATORVASTATIN CALCIUM 80 MG PO TABS
80.0000 mg | ORAL_TABLET | ORAL | Status: DC
Start: 2017-01-18 — End: 2017-01-18
  Filled 2017-01-18: qty 1

## 2017-01-18 MED ORDER — LIDOCAINE HCL (PF) 1 % IJ SOLN
INTRAMUSCULAR | Status: AC
  Filled 2017-01-18: qty 30

## 2017-01-18 MED ORDER — SODIUM CHLORIDE 0.9 % IV SOLN: 250.0000 mL | INTRAVENOUS | Status: DC | PRN

## 2017-01-18 MED ORDER — HEPARIN (PORCINE) IN NACL 2-0.9 UNIT/ML-% IJ SOLN
INTRAMUSCULAR | Status: AC
  Filled 2017-01-18: qty 500

## 2017-01-18 MED ORDER — IOPAMIDOL (ISOVUE-370) INJECTION 76%
INTRAVENOUS | Status: DC | PRN
  Administered 2017-01-18: 50 mL via INTRA_ARTERIAL

## 2017-01-18 MED ORDER — LIDOCAINE HCL (PF) 1 % IJ SOLN
INTRAMUSCULAR | Status: DC | PRN
  Administered 2017-01-18: 2 mL

## 2017-01-18 SURGICAL SUPPLY — 12 items
CATH INFINITI 5 FR JL3.5 (CATHETERS) ×2 IMPLANT
CATH INFINITI JR4 5F (CATHETERS) ×2 IMPLANT
COVER PRB 48X5XTLSCP FOLD TPE (BAG) ×1 IMPLANT
COVER PROBE 5X48 (BAG) ×1
DEVICE RAD COMP TR BAND LRG (VASCULAR PRODUCTS) ×2 IMPLANT
GLIDESHEATH SLEND SS 6F .021 (SHEATH) ×2 IMPLANT
GUIDEWIRE INQWIRE 1.5J.035X260 (WIRE) ×1 IMPLANT
INQWIRE 1.5J .035X260CM (WIRE) ×2
KIT HEART LEFT (KITS) ×2 IMPLANT
PACK CARDIAC CATHETERIZATION (CUSTOM PROCEDURE TRAY) ×2 IMPLANT
TRANSDUCER W/STOPCOCK (MISCELLANEOUS) ×2 IMPLANT
TUBING CIL FLEX 10 FLL-RA (TUBING) ×2 IMPLANT

## 2017-01-18 NOTE — Discharge Instructions (Signed)
Moderate Conscious Sedation, Adult, Care After These instructions provide you with information about caring for yourself after your procedure. Your health care provider may also give you more specific instructions. Your treatment has been planned according to current medical practices, but problems sometimes occur. Call your health care provider if you have any problems or questions after your procedure. What can I expect after the procedure? After your procedure, it is common:  To feel sleepy for several hours.  To feel clumsy and have poor balance for several hours.  To have poor judgment for several hours.  To vomit if you eat too soon.  Follow these instructions at home: For at least 24 hours after the procedure:   Do not: ? Participate in activities where you could fall or become injured. ? Drive. ? Use heavy machinery. ? Drink alcohol. ? Take sleeping pills or medicines that cause drowsiness. ? Make important decisions or sign legal documents. ? Take care of children on your own.  Rest. Eating and drinking  Follow the diet recommended by your health care provider.  If you vomit: ? Drink water, juice, or soup when you can drink without vomiting. ? Make sure you have little or no nausea before eating solid foods. General instructions  Have a responsible adult stay with you until you are awake and alert.  Take over-the-counter and prescription medicines only as told by your health care provider.  If you smoke, do not smoke without supervision.  Keep all follow-up visits as told by your health care provider. This is important. Contact a health care provider if:  You keep feeling nauseous or you keep vomiting.  You feel light-headed.  You develop a rash.  You have a fever. Get help right away if:  You have trouble breathing. This information is not intended to replace advice given to you by your health care provider. Make sure you discuss any questions you have  with your health care provider. Document Released: 12/07/2012 Document Revised: 07/22/2015 Document Reviewed: 06/08/2015 Elsevier Interactive Patient Education  2018 Elsevier Inc. Radial Site Care Refer to this sheet in the next few weeks. These instructions provide you with information about caring for yourself after your procedure. Your health care provider may also give you more specific instructions. Your treatment has been planned according to current medical practices, but problems sometimes occur. Call your health care provider if you have any problems or questions after your procedure. What can I expect after the procedure? After your procedure, it is typical to have the following:  Bruising at the radial site that usually fades within 1-2 weeks.  Blood collecting in the tissue (hematoma) that may be painful to the touch. It should usually decrease in size and tenderness within 1-2 weeks.  Follow these instructions at home:  Take medicines only as directed by your health care provider.  You may shower 24-48 hours after the procedure or as directed by your health care provider. Remove the bandage (dressing) and gently wash the site with plain soap and water. Pat the area dry with a clean towel. Do not rub the site, because this may cause bleeding.  Do not take baths, swim, or use a hot tub until your health care provider approves.  Check your insertion site every day for redness, swelling, or drainage.  Do not apply powder or lotion to the site.  Do not flex or bend the affected arm for 24 hours or as directed by your health care provider.  Do not   push or pull heavy objects with the affected arm for 24 hours or as directed by your health care provider.  Do not lift over 10 lb (4.5 kg) for 5 days after your procedure or as directed by your health care provider.  Ask your health care provider when it is okay to: ? Return to work or school. ? Resume usual physical activities or  sports. ? Resume sexual activity.  Do not drive home if you are discharged the same day as the procedure. Have someone else drive you.  You may drive 24 hours after the procedure unless otherwise instructed by your health care provider.  Do not operate machinery or power tools for 24 hours after the procedure.  If your procedure was done as an outpatient procedure, which means that you went home the same day as your procedure, a responsible adult should be with you for the first 24 hours after you arrive home.  Keep all follow-up visits as directed by your health care provider. This is important. Contact a health care provider if:  You have a fever.  You have chills.  You have increased bleeding from the radial site. Hold pressure on the site. Get help right away if:  You have unusual pain at the radial site.  You have redness, warmth, or swelling at the radial site.  You have drainage (other than a small amount of blood on the dressing) from the radial site.  The radial site is bleeding, and the bleeding does not stop after 30 minutes of holding steady pressure on the site.  Your arm or hand becomes pale, cool, tingly, or numb. This information is not intended to replace advice given to you by your health care provider. Make sure you discuss any questions you have with your health care provider. Document Released: 03/21/2010 Document Revised: 07/25/2015 Document Reviewed: 09/04/2013 Elsevier Interactive Patient Education  2018 Elsevier Inc.  

## 2017-01-18 NOTE — Progress Notes (Signed)
Client with small amt swelling noted proximal to TR band and TR band moved by Derinda,[facility]

## 2017-01-18 NOTE — Interval H&P Note (Signed)
Cath Lab Visit (complete for each Cath Lab visit)  Clinical Evaluation Leading to the Procedure:   ACS: No.  Non-ACS:    Anginal Classification: CCS III  Anti-ischemic medical therapy: Minimal Therapy (1 class of medications)  Non-Invasive Test Results: No non-invasive testing performed  Prior CABG: No previous CABG      History and Physical Interval Note:  01/18/2017 7:40 AM  Massie Kluver  has presented today for surgery, with the diagnosis of angina  The various methods of treatment have been discussed with the patient and family. After consideration of risks, benefits and other options for treatment, the patient has consented to  Procedure(s): LEFT HEART CATH AND CORONARY ANGIOGRAPHY (N/A) as a surgical intervention .  The patient's history has been reviewed, patient examined, no change in status, stable for surgery.  I have reviewed the patient's chart and labs.  Questions were answered to the patient's satisfaction.     Larae Grooms

## 2017-02-15 NOTE — Progress Notes (Signed)
Cardiology Office Note  Date: 02/16/2017   ID: Elaine, Alvarez [birth year], MRN [id number]  PCP: Elaine Major, MD  Primary Cardiologist: Elaine Lesches, MD   Chief Complaint  Patient presents with  . Cardiac follow-up    History of Present Illness: Elaine Alvarez is a 71 y.o. female seen in consultation in November for evaluation of progressive dyspnea on exertion. She was referred for a diagnostic cardiac catheterization was performed by Dr. Irish Alvarez on November 19. Fortunately, procedure revealed only mild coronary atherosclerosis with LVEF 55-65% and elevated LVEDP of 21 mmHg.  She presents today for follow-up. States that she has had less dyspnea on exertion. Only Alvarez interval change in her medications includes the addition of Lipitor. We discussed the results of her cardiac catheterization today. In general her blood pressure has been well controlled. I am reluctant to start her on a standing diuretic at this time. I recommended sodium restriction, regular exercise plan. She also states that she is being referred to a pulmonologist by her PCP.  Past Medical History:  Diagnosis Date  . Anxiety   . Depression   . GERD (gastroesophageal reflux disease)   . Irritable bowel syndrome   . Osteoarthritis     Past Surgical History:  Procedure Laterality Date  . ABDOMINAL HYSTERECTOMY  1984  . APPENDECTOMY    . CHOLECYSTECTOMY  1974  . COLONOSCOPY  08/03/2007   RMR: diminutive distal rectal polyps (2), Left-sided transverse diverticula HYPERPLASTIC POLYPS  . COLONOSCOPY  02/17/2012   OFB:[id number] hemorrhoids; probable rectal prolapse-s/p  rectal mucosal biopsy. Colonic polyp-biopsied/Pancolonic diverticulosis. Benign polyp.   . 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 HEMORRHOID SURGERY  2014   Dr.Waters  . LEFT HEART CATH AND CORONARY ANGIOGRAPHY  N/A 01/18/2017   Procedure: LEFT HEART CATH AND CORONARY ANGIOGRAPHY;  Surgeon: Elaine Booze, MD;  Location: [facility];  Service: Cardiovascular;  Laterality: N/A;  . PROLAPSED UTERINE FIBROID LIGATION  2010  . RECTOCELE REPAIR      Current Outpatient Medications  Medication Sig Dispense Refill  . acetaminophen (TYLENOL) 500 MG tablet Take 1,000 mg every 6 (six) hours as needed by mouth for moderate pain or headache.    Marland Kitchen atorvastatin (LIPITOR) 80 MG tablet Take 1 tablet (80 mg total) daily by mouth. 30 tablet 1  . clonazePAM (KLONOPIN) 1 MG tablet Take 1-2 mg 3 (three) times daily as needed by mouth for anxiety.     . diclofenac sodium (VOLTAREN) 1 % GEL Apply 4 g to affected area (knee or spine) 4 times daily. Can use with lidocaine gel.    . esomeprazole (NEXIUM) 20 MG capsule Take 20 mg daily by mouth.     . lidocaine (LMX) 4 % cream Apply 1 application as needed topically (for knee pain).     . multivitamin-iron-minerals-folic acid (CENTRUM) chewable tablet Chew 1 tablet by mouth daily.    . sertraline (ZOLOFT) 50 MG tablet TAKE 50 MG BY MOUTH DAILY AS DIRECTED FOR ANXIETY AND DEPRESSION.  3  . traZODone (DESYREL) 50 MG tablet Take 50 mg at bedtime by mouth.     No current facility-administered medications for this visit.    Allergies:  Sulfonamide derivatives   Social History: The patient  reports that  has never smoked. she has never used smokeless tobacco. She reports that she does not drink alcohol or use drugs.  ROS:  Please see the history of present illness. Otherwise, complete review of systems is positive for NYHA class II dyspnea.  All other systems are reviewed and negative.   Physical Exam: VS:  BP (!) 106/58 (BP Location: Left Arm)   Pulse 74   Ht 5\' 3"  (1.6 m)   Wt 215 lb (97.5 kg)   SpO2 91%   BMI 38.09 kg/m , BMI Body mass index is 38.09 kg/m.  Wt Readings from Last 3 Encounters:  02/16/17 215 lb (97.5 kg)  01/18/17 213 lb (96.6 kg)    01/12/17 218 lb (98.9 kg)    General: Patient appears comfortable at rest. HEENT: Conjunctiva and lids normal, oropharynx clear. Neck: Supple, no elevated JVP or carotid bruits, no thyromegaly. Lungs: Clear to auscultation, nonlabored breathing at rest. Cardiac: Regular rate and rhythm, no S3, soft systolic murmur, no pericardial rub. Abdomen: Soft, nontender, bowel sounds present, no guarding or rebound. Extremities: No pitting edema, distal pulses 2+. Skin: Warm and dry. Musculoskeletal: No kyphosis. Neuropsychiatric: Alert and oriented x3, affect grossly appropriate.  ECG: I personally reviewed the tracing from 12/28/2016 which showed sinus bradycardia with R' in lead V1  Recent Labwork: 01/12/2017: BUN 9; Creatinine, Ser 0.91; Hemoglobin 14.0; Platelets 153; Potassium 4.2; Sodium 140   Other Studies Reviewed Today:  Cardiac catheterization 01/18/2017:  Mid RCA lesion is 10% stenosed.  Prox LAD lesion is 10% stenosed.  2nd Mrg lesion is 10% stenosed.  There is no aortic valve stenosis.  The left ventricular systolic function is normal.  LV end diastolic pressure is moderately elevated.  The left ventricular ejection fraction is 55-65% by visual estimate.  Assessment and Plan:  1. Mild coronary atherosclerosis by recent cardiac catheterization in November as detailed above. Not likely that this is leading to any specific exertional symptoms at this point. Lipitor was added for aggressive lipid management. I have recommended that she follow-up with her PCP to ensure that LDL is 70 or less, she might not even need the high dose of Lipitor long-term. She is tolerating it so far. Would also suggest a baby aspirin daily.  2. Elevated LVEDP at cardiac catheterization with normal LVEF. She does not have history of hypertension and her blood pressure is low normal today. I am reluctant to put her on a standing diuretic therefore. We discussed salt restriction and regular exercise  plan. Would keep follow-up with PCP. HCTZ or chlorthalidone might be a reasonable option, particularly if her blood pressure trends upward.  3. GERD, she continues on Nexium.  Current medicines were reviewed with the patient today.  Disposition: Follow-up as needed.  Signed, Satira Sark, MD, [facility] 02/16/2017 1:53 PM    [place] [facility] [address] [place], [place] [address] Phone: [phone]; Fax: [phone]

## 2017-02-16 ENCOUNTER — Ambulatory Visit (INDEPENDENT_AMBULATORY_CARE_PROVIDER_SITE_OTHER): Payer: Medicare Other | Admitting: Cardiology

## 2017-02-16 ENCOUNTER — Encounter: Payer: Self-pay | Admitting: Cardiology

## 2017-02-16 VITALS — BP 106/58 | HR 74 | Ht 63.0 in | Wt 215.0 lb

## 2017-02-16 DIAGNOSIS — R0609 Other forms of dyspnea: Secondary | ICD-10-CM | POA: Diagnosis not present

## 2017-02-16 DIAGNOSIS — I2510 Atherosclerotic heart disease of native coronary artery without angina pectoris: Secondary | ICD-10-CM | POA: Diagnosis not present

## 2017-02-16 DIAGNOSIS — I200 Unstable angina: Secondary | ICD-10-CM

## 2017-02-16 DIAGNOSIS — K219 Gastro-esophageal reflux disease without esophagitis: Secondary | ICD-10-CM | POA: Diagnosis not present

## 2017-02-16 NOTE — Patient Instructions (Signed)
.   Your physician recommends that you schedule a follow-up appointment in:  As needed with Dr.McDowell       Thank you for choosing [facility] !

## 2017-06-22 ENCOUNTER — Other Ambulatory Visit (HOSPITAL_COMMUNITY): Payer: Self-pay | Admitting: Family Medicine

## 2017-06-22 DIAGNOSIS — Z1231 Encounter for screening mammogram for malignant neoplasm of breast: Secondary | ICD-10-CM

## 2017-06-24 ENCOUNTER — Encounter: Payer: Self-pay | Admitting: Internal Medicine

## 2017-07-09 ENCOUNTER — Ambulatory Visit (HOSPITAL_COMMUNITY): Payer: Medicare Other

## 2017-07-30 ENCOUNTER — Ambulatory Visit (HOSPITAL_COMMUNITY): Payer: Medicare HMO

## 2017-11-08 ENCOUNTER — Other Ambulatory Visit: Payer: Self-pay

## 2017-11-08 ENCOUNTER — Emergency Department (HOSPITAL_COMMUNITY): Payer: Medicare HMO

## 2017-11-08 ENCOUNTER — Encounter (HOSPITAL_COMMUNITY): Payer: Self-pay

## 2017-11-08 ENCOUNTER — Emergency Department (HOSPITAL_COMMUNITY)
Admission: EM | Admit: 2017-11-08 | Discharge: 2017-11-08 | Disposition: A | Discharge status: Home / Self Care | Payer: Medicare HMO | Attending: Emergency Medicine | Admitting: Emergency Medicine

## 2017-11-08 DIAGNOSIS — R109 Unspecified abdominal pain: Secondary | ICD-10-CM | POA: Diagnosis present

## 2017-11-08 DIAGNOSIS — Z79899 Other long term (current) drug therapy: Secondary | ICD-10-CM | POA: Insufficient documentation

## 2017-11-08 DIAGNOSIS — R112 Nausea with vomiting, unspecified: Secondary | ICD-10-CM | POA: Diagnosis not present

## 2017-11-08 DIAGNOSIS — I10 Essential (primary) hypertension: Secondary | ICD-10-CM | POA: Insufficient documentation

## 2017-11-08 DIAGNOSIS — K5792 Diverticulitis of intestine, part unspecified, without perforation or abscess without bleeding: Secondary | ICD-10-CM | POA: Diagnosis not present

## 2017-11-08 LAB — COMPREHENSIVE METABOLIC PANEL
ALT: 15 U/L (ref 0–44)
AST: 18 U/L (ref 15–41)
Albumin: 4 g/dL (ref 3.5–5.0)
Alkaline Phosphatase: 79 U/L (ref 38–126)
Anion gap: 4 — ABNORMAL LOW (ref 5–15)
BILIRUBIN TOTAL: 1.3 mg/dL — AB (ref 0.3–1.2)
BUN: 15 mg/dL (ref 8–23)
CALCIUM: 9.3 mg/dL (ref 8.9–10.3)
CO2: 34 mmol/L — ABNORMAL HIGH (ref 22–32)
CREATININE: 1.01 mg/dL — AB (ref 0.44–1.00)
Chloride: 100 mmol/L (ref 98–111)
GFR calc Af Amer: >60 mL/min (ref >60)
GFR calc non Af Amer: 55 mL/min — ABNORMAL LOW (ref >60)
Glucose, Bld: 116 mg/dL — ABNORMAL HIGH (ref 70–99)
Potassium: 3.4 mmol/L — ABNORMAL LOW (ref 3.5–5.1)
Sodium: 138 mmol/L (ref 135–145)
TOTAL PROTEIN: 7.2 g/dL (ref 6.5–8.1)

## 2017-11-08 LAB — CBC WITH DIFFERENTIAL/PLATELET
BASOS PCT: 1 %
Basophils Absolute: 0 10*3/uL (ref 0.0–0.1)
EOS PCT: 1 %
Eosinophils Absolute: 0.1 10*3/uL (ref 0.0–0.7)
HCT: 45.7 % (ref 36.0–46.0)
Hemoglobin: 15.4 g/dL — ABNORMAL HIGH (ref 12.0–15.0)
Lymphocytes Relative: 25 %
Lymphs Abs: 1.4 10*3/uL (ref 0.7–4.0)
MCH: 31.1 pg (ref 26.0–34.0)
MCHC: 33.7 g/dL (ref 30.0–36.0)
MCV: 92.3 fL (ref 78.0–100.0)
MONO ABS: 0.7 10*3/uL (ref 0.1–1.0)
Monocytes Relative: 13 %
Neutro Abs: 3.4 10*3/uL (ref 1.7–7.7)
Neutrophils Relative %: 60 %
PLATELETS: 143 10*3/uL — AB (ref 150–400)
RBC: 4.95 MIL/uL (ref 3.87–5.11)
RDW: 13.4 % (ref 11.5–15.5)
WBC: 5.7 10*3/uL (ref 4.0–10.5)

## 2017-11-08 LAB — URINALYSIS, ROUTINE W REFLEX MICROSCOPIC
Bilirubin Urine: NEGATIVE
GLUCOSE, UA: NEGATIVE mg/dL
HGB URINE DIPSTICK: NEGATIVE
Ketones, ur: NEGATIVE mg/dL
Leukocytes, UA: NEGATIVE
Nitrite: NEGATIVE
PROTEIN: NEGATIVE mg/dL
Specific Gravity, Urine: 1.006 (ref 1.005–1.030)
pH: 6 (ref 5.0–8.0)

## 2017-11-08 LAB — TYPE AND SCREEN
ABO/RH(D): O POS
Antibody Screen: NEGATIVE

## 2017-11-08 LAB — LIPASE, BLOOD: Lipase: 23 U/L (ref 11–51)

## 2017-11-08 MED ORDER — ONDANSETRON HCL 4 MG/2ML IJ SOLN: 4.0000 mg | Freq: Once | INTRAMUSCULAR | Status: DC

## 2017-11-08 MED ORDER — CIPROFLOXACIN HCL 500 MG PO TABS: 500.0000 mg | ORAL_TABLET | Freq: Two times a day (BID) | ORAL | 0 refills | Status: DC

## 2017-11-08 MED ORDER — ONDANSETRON HCL 4 MG PO TABS: 4.0000 mg | ORAL_TABLET | Freq: Four times a day (QID) | ORAL | 0 refills | Status: DC | PRN

## 2017-11-08 MED ORDER — ACETAMINOPHEN 325 MG PO TABS
650.0000 mg | ORAL_TABLET | Freq: Once | ORAL | Status: AC
  Administered 2017-11-08: 650 mg via ORAL
  Filled 2017-11-08: qty 2

## 2017-11-08 MED ORDER — SODIUM CHLORIDE 0.9 % IV BOLUS
1000.0000 mL | Freq: Once | INTRAVENOUS | Status: AC
  Administered 2017-11-08: 1000 mL via INTRAVENOUS

## 2017-11-08 MED ORDER — METRONIDAZOLE 500 MG PO TABS: 500.0000 mg | ORAL_TABLET | Freq: Three times a day (TID) | ORAL | 0 refills | Status: DC

## 2017-11-08 MED ORDER — DIAZEPAM 2 MG PO TABS
2.0000 mg | ORAL_TABLET | Freq: Once | ORAL | Status: AC
  Administered 2017-11-08: 2 mg via ORAL
  Filled 2017-11-08: qty 1

## 2017-11-08 MED ORDER — CIPROFLOXACIN IN D5W 400 MG/200ML IV SOLN
400.0000 mg | Freq: Once | INTRAVENOUS | Status: AC
  Administered 2017-11-08: 400 mg via INTRAVENOUS
  Filled 2017-11-08: qty 200

## 2017-11-08 MED ORDER — METRONIDAZOLE IN NACL 5-0.79 MG/ML-% IV SOLN
500.0000 mg | Freq: Once | INTRAVENOUS | Status: AC
  Administered 2017-11-08: 500 mg via INTRAVENOUS
  Filled 2017-11-08: qty 100

## 2017-11-08 MED ORDER — DIAZEPAM 5 MG PO TABS: 2.5000 mg | ORAL_TABLET | Freq: Four times a day (QID) | ORAL | 0 refills | Status: DC | PRN

## 2017-11-08 MED ORDER — ONDANSETRON HCL 4 MG/2ML IJ SOLN
4.0000 mg | Freq: Once | INTRAMUSCULAR | Status: AC
  Administered 2017-11-08: 4 mg via INTRAVENOUS
  Filled 2017-11-08: qty 2

## 2017-11-08 NOTE — Discharge Instructions (Addendum)
Return if pain is getting worse, you start running a fever, or if you are vomiting in spite of the nausea medicine.  I think your back pain is probably related to muscle spasm rather than the diverticulitis and this is what the Valium is for.  The Valium can also help with your nausea as well so be careful taking it with the nausea medication.  Make sure you are doing stretching exercises, massage and heat to help with muscle spasm.

## 2017-11-08 NOTE — ED Provider Notes (Signed)
[facility] Provider Note   CSN: [id number] Arrival date & time: 11/08/17  2841     History   Chief Complaint Chief Complaint  Patient presents with  . Abdominal Pain    HPI Elaine Alvarez is a 72 y.o. female.  The history is provided by the patient.  She has history of anxiety, depression, hypertension, GERD and comes in complaining of right flank pain for the last week.  Pain has been constant, although it will periodically get worse.  It does not seem to be affected by body position.  There is perhaps slight, temporary improvement after a bowel movement.  During this week, she has had nausea with intermittent vomiting.  Last episode of emesis was 2 days ago, but she has had some dry heaves since then.  She has also had some diarrhea.  She relates 3 loose bowel movements yesterday which were maroon in color.  She has difficulty putting a number on her pain but states that it is severe.  She does take naproxen twice a day, but is not on any anticoagulants or aspirin.  She denies fever, chills, sweats.  She has had some lightheadedness.  Past Medical History:  Diagnosis Date  . Anxiety   . Depression   . GERD (gastroesophageal reflux disease)   . Irritable bowel syndrome   . Osteoarthritis     Patient Active Problem List   Diagnosis Date Noted  . Coronary artery calcification   . Rectal bleeding 02/14/2012  . COLONIC POLYPS, HYPERPLASTIC 01/09/2009  . DIVERTICULOSIS, COLON 01/09/2009  . CONSTIPATION, CHRONIC 01/09/2009  . PEPTIC ULCER, ACUTE, HEMORRHAGE, HX OF 01/09/2009  . MEMORY LOSS 07/27/2007  . ANEMIA-NOS 06/29/2007  . ANXIETY 06/29/2007  . DEPRESSION 06/29/2007  . HYPERTENSION 06/29/2007  . VARICOSE VEINS LOWER EXTREMITIES W/INFLAMMATION 06/29/2007  . Dyspnea 06/29/2007  . GERD 06/29/2007  . PEPTIC ULCER DISEASE 06/29/2007  . RECTOCELE WITHOUT MENTION OF UTERINE PROLAPSE 06/29/2007  . ARTHRITIS 06/29/2007  . LOW BACK PAIN 06/29/2007  .  ABDOMINAL PAIN, LEFT LOWER QUADRANT 06/29/2007  . CATARACT, HX OF 06/29/2007    Past Surgical History:  Procedure Laterality Date  . ABDOMINAL HYSTERECTOMY  1984  . APPENDECTOMY    . CHOLECYSTECTOMY  1974  . COLONOSCOPY  08/03/2007   RMR: diminutive distal rectal polyps (2), Left-sided transverse diverticula HYPERPLASTIC POLYPS  . COLONOSCOPY  02/17/2012   LKG:[id number] hemorrhoids; probable rectal prolapse-s/p  rectal mucosal biopsy. Colonic polyp-biopsied/Pancolonic diverticulosis. Benign polyp.   . ESOPHAGOGASTRODUODENOSCOPY  08/03/2007   DGU:[id number] benign tubular peptic stricture status post dilation/   Hiatal hernia, erosive/ulcerative esophagitis  . HEMORRHOID SURGERY  2014   Dr.Waters  . LEFT HEART CATH AND CORONARY ANGIOGRAPHY N/A 01/18/2017   Procedure: LEFT HEART CATH AND CORONARY ANGIOGRAPHY;  Surgeon: Jettie Booze, MD;  Location: [facility];  Service: Cardiovascular;  Laterality: N/A;  . PROLAPSED UTERINE FIBROID LIGATION  2010  . RECTOCELE REPAIR       OB History   None      Home Medications    Prior to Admission medications   Medication Sig Start Date End Date Taking? Authorizing Provider  acetaminophen (TYLENOL) 500 MG tablet Take 1,000 mg every 6 (six) hours as needed by mouth for moderate pain or headache.    [provider]  atorvastatin (LIPITOR) 80 MG tablet Take 1 tablet (80 mg total) daily by mouth. 01/18/17   Cheryln Manly, NP  clonazePAM (KLONOPIN) 1 MG tablet Take 1-2 mg  3 (three) times daily as needed by mouth for anxiety.  01/11/12   [provider]  diclofenac sodium (VOLTAREN) 1 % GEL Apply 4 g to affected area (knee or spine) 4 times daily. Can use with lidocaine gel. 01/11/17   [provider]  esomeprazole (NEXIUM) 20 MG capsule Take 20 mg daily by mouth.     [provider]  lidocaine (LMX) 4 % cream Apply 1 application as needed topically (for knee pain).     [provider]    multivitamin-iron-minerals-folic acid (CENTRUM) chewable tablet Chew 1 tablet by mouth daily.    [provider]  sertraline (ZOLOFT) 50 MG tablet TAKE 50 MG BY MOUTH DAILY AS DIRECTED FOR ANXIETY AND DEPRESSION. 12/15/16   [provider]  traZODone (DESYREL) 50 MG tablet Take 50 mg at bedtime by mouth.    [provider]    Family History Family History  Problem Relation Age of Onset  . Transient ischemic attack Mother   . Lung cancer Father   . Colon cancer Neg Hx   . Dementia Neg Hx     Social History Social History   Tobacco Use  . Smoking status: Never Smoker  . Smokeless tobacco: Never Used  Substance Use Topics  . Alcohol use: No  . Drug use: No     Allergies   Sulfonamide derivatives   Review of Systems Review of Systems  All other systems reviewed and are negative.    Physical Exam Updated Vital Signs BP (!) 134/105   Pulse 72   Temp 97.7 F (36.5 C) (Oral)   Resp 18   Wt 94.8 kg   SpO2 96%   BMI 37.02 kg/m   Physical Exam  Nursing note and vitals reviewed.  72 year old female, resting comfortably and in no acute distress. Vital signs are significant for elevated diastolic blood pressure. Oxygen saturation is 96%, which is normal. Head is normocephalic and atraumatic. PERRLA, EOMI. Oropharynx is clear. Neck is nontender and supple without adenopathy or JVD. Back is nontender in the midline.  There is moderate right CVA tenderness. Lungs are clear without rales, wheezes, or rhonchi. Chest is nontender. Heart has regular rate and rhythm without murmur. Abdomen is soft, flat, nontender without masses or hepatosplenomegaly and peristalsis is hypoactive. Rectal: Normal sphincter tone.  Small amount of brown stool which is Hemoccult negative. Extremities have no cyanosis or edema, full range of motion is present. Skin is warm and dry without rash. Neurologic: Mental status is normal, cranial nerves are intact, there are no  motor or sensory deficits.  Resting tremor present, consistent with known history of essential tremor.  ED Treatments / Results  Labs (all labs ordered are listed, but only abnormal results are displayed) Labs Reviewed  URINALYSIS, ROUTINE W REFLEX MICROSCOPIC  COMPREHENSIVE METABOLIC PANEL  LIPASE, BLOOD  CBC WITH DIFFERENTIAL/PLATELET  TYPE AND SCREEN    Radiology Ct Renal Stone Study  Result Date: 11/08/2017 CLINICAL DATA:  Back pain, nausea, decreased appetite. EXAM: CT ABDOMEN AND PELVIS WITHOUT CONTRAST TECHNIQUE: Multidetector CT imaging of the abdomen and pelvis was performed following the standard protocol without IV contrast. COMPARISON:  Body CT 06/23/2007 FINDINGS: Lower chest: No acute abnormality. Mild calcific atherosclerotic disease of the aorta and coronary arteries. Hepatobiliary: No focal liver abnormality is seen. Status post cholecystectomy. No biliary dilatation. Pancreas: Fatty replacement of the pancreas. Spleen: No splenic injury or perisplenic hematoma. Adrenals/Urinary Tract: Adrenal glands are unremarkable. Kidneys are normal, without renal  calculi, focal lesion, or hydronephrosis. Bladder is unremarkable. Stomach/Bowel: Stomach is within normal limits. Post appendectomy. No evidence of bowel wall thickening, distention, or inflammatory changes. Left colonic diverticulosis. Mild mucosal thickening of the sigmoid colon without significant pericolonic inflammatory changes may represent chronic diverticulitis. Vascular/Lymphatic: Aortic atherosclerosis. No enlarged abdominal or pelvic lymph nodes. Reproductive: Status post hysterectomy. No adnexal masses. Other: No abdominal wall hernia or abnormality. No abdominopelvic ascites. Musculoskeletal: Spondylosis of the lumbosacral spine. IMPRESSION: No evidence of acute abnormalities within the abdomen or pelvis. Diffuse left colonic diverticulosis with suspected chronic diverticulitis of the sigmoid colon. Electronically Signed    By: Fidela Salisbury M.D.   On: 11/08/2017 07:34    Procedures Procedures   Medications Ordered in ED Medications  ciprofloxacin (CIPRO) IVPB 400 mg (has no administration in time range)  metroNIDAZOLE (FLAGYL) IVPB 500 mg (has no administration in time range)  ondansetron (ZOFRAN) injection 4 mg (4 mg Intravenous Given 11/08/17 0714)     Initial Impression / Assessment and Plan / ED Course  I have reviewed the triage vital signs and the nursing notes.  Pertinent labs & imaging results that were available during my care of the patient were reviewed by me and considered in my medical decision making (see chart for details).  Flank pain with nausea and diarrhea of uncertain cause.  Exam is benign.  Diagnostic possibilities include diverticulitis, urolithiasis, a urinary tract infection, musculoskeletal pain, pancreatitis.  Old records are reviewed, and she has had abdominal CT scan showing diverticulosis - last abdominal CT scan was in 2009.  Will check screening labs and sent for CT of abdomen and pelvis.  CT is consistent with sigmoid diverticulitis.  Labs are pending.  She is given an initial dose of ciprofloxacin and metronidazole.  Case is signed out to Dr. Dayna Barker to evaluate her lab reports.  If she is able to tolerate oral medications, anticipate she should be able to be discharged on ciprofloxacin and metronidazole.  Orthostatic vital signs did show a drop in blood pressure and increase in heart rate.  This is likely secondary to dehydration, and she is given IV fluids.  Final Clinical Impressions(s) / ED Diagnoses   Final diagnoses:  Right flank pain  Diverticulitis  Non-intractable vomiting with nausea, unspecified vomiting type    ED Discharge Orders    None       Delora Fuel, MD 95/18/84 [phone]

## 2017-11-08 NOTE — ED Notes (Signed)
Pt's O2 level has decreased to 91% on RA. Have applied 2L [place]

## 2017-11-08 NOTE — ED Triage Notes (Addendum)
Pt reports lower back pain with nausea, constipation earlier in the week, then diarrhea for the past couple of days.  Decreased appetite for about one week. Pt has hx of IBS.  Pt says she has lost 16 lbs in the past week. Reports vomiting Saturday, but that was the last time.

## 2017-11-08 NOTE — ED Provider Notes (Signed)
9:19 AM Assumed care from Dr. Roxanne Mins, please see their note for full history, physical and decision making until this point. In brief this is a 72 y.o. year old female who presented to the ED tonight with Back Pain (nausea,diarrhea)     Patient found to diverticulitis as the cause of her GI symptoms.  Pending labs to make sure she is able to go home.  Labs are within normal limits.  On reevaluation patient still some right-sided back pain.  On exam she has a palpable muscle spasm right paraspinal lower thoracic area.  Will try muscle relaxer, heat pad and stretching.  Do not think this is related to her diverticulitis.  Had improvement in symptoms with valium and heat. Will suggest same at home. abx for diverticulitis given. pcp fu suggested.   Discharge instructions, including strict return precautions for new or worsening symptoms, given. Patient and/or family verbalized understanding and agreement with the plan as described.   Labs, studies and imaging reviewed by myself and considered in medical decision making if ordered. Imaging interpreted by radiology.  Labs Reviewed  COMPREHENSIVE METABOLIC PANEL - Abnormal; Notable for the following components:      Result Value   Potassium 3.4 (*)    CO2 34 (*)    Glucose, Bld 116 (*)    Creatinine, Ser 1.01 (*)    Total Bilirubin 1.3 (*)    GFR calc non Af Amer 55 (*)    Anion gap 4 (*)    All other components within normal limits  CBC WITH DIFFERENTIAL/PLATELET - Abnormal; Notable for the following components:   Hemoglobin 15.4 (*)    Platelets 143 (*)    All other components within normal limits  LIPASE, BLOOD  URINALYSIS, ROUTINE W REFLEX MICROSCOPIC  TYPE AND SCREEN    CT Renal Stone Study  Final Result      No follow-ups on file.    Merrily Pew, MD 11/08/17 1536

## 2017-11-22 ENCOUNTER — Other Ambulatory Visit (HOSPITAL_COMMUNITY): Payer: Self-pay | Admitting: Family Medicine

## 2017-11-22 DIAGNOSIS — R1032 Left lower quadrant pain: Secondary | ICD-10-CM

## 2017-11-22 DIAGNOSIS — K5792 Diverticulitis of intestine, part unspecified, without perforation or abscess without bleeding: Secondary | ICD-10-CM

## 2017-11-25 ENCOUNTER — Ambulatory Visit (HOSPITAL_COMMUNITY)
Admission: RE | Admit: 2017-11-25 | Discharge: 2017-11-25 | Disposition: A | Discharge status: Home / Self Care | Payer: Medicare HMO | Source: Ambulatory Visit | Attending: Family Medicine | Admitting: Family Medicine

## 2017-11-25 ENCOUNTER — Encounter: Payer: Self-pay | Admitting: Internal Medicine

## 2017-11-25 ENCOUNTER — Encounter (HOSPITAL_COMMUNITY): Payer: Self-pay | Admitting: Radiology

## 2017-11-25 DIAGNOSIS — K5730 Diverticulosis of large intestine without perforation or abscess without bleeding: Secondary | ICD-10-CM | POA: Diagnosis not present

## 2017-11-25 DIAGNOSIS — K5792 Diverticulitis of intestine, part unspecified, without perforation or abscess without bleeding: Secondary | ICD-10-CM | POA: Diagnosis present

## 2017-11-25 DIAGNOSIS — R1032 Left lower quadrant pain: Secondary | ICD-10-CM | POA: Diagnosis present

## 2017-11-25 MED ORDER — IOPAMIDOL (ISOVUE-300) INJECTION 61%
100.0000 mL | Freq: Once | INTRAVENOUS | Status: AC | PRN
  Administered 2017-11-25: 100 mL via INTRAVENOUS

## 2017-12-13 ENCOUNTER — Other Ambulatory Visit: Payer: Self-pay

## 2017-12-13 ENCOUNTER — Emergency Department (HOSPITAL_COMMUNITY)
Admission: EM | Admit: 2017-12-13 | Discharge: 2017-12-13 | Disposition: A | Discharge status: Home / Self Care | Payer: Medicare HMO | Attending: Emergency Medicine | Admitting: Emergency Medicine

## 2017-12-13 ENCOUNTER — Encounter (HOSPITAL_COMMUNITY): Payer: Self-pay | Admitting: Emergency Medicine

## 2017-12-13 DIAGNOSIS — R112 Nausea with vomiting, unspecified: Secondary | ICD-10-CM | POA: Insufficient documentation

## 2017-12-13 DIAGNOSIS — I10 Essential (primary) hypertension: Secondary | ICD-10-CM | POA: Diagnosis not present

## 2017-12-13 DIAGNOSIS — Z79899 Other long term (current) drug therapy: Secondary | ICD-10-CM | POA: Diagnosis not present

## 2017-12-13 LAB — CBC
HCT: 48.5 % — ABNORMAL HIGH (ref 36.0–46.0)
HEMOGLOBIN: 15.4 g/dL — AB (ref 12.0–15.0)
MCH: 29.1 pg (ref 26.0–34.0)
MCHC: 31.8 g/dL (ref 30.0–36.0)
MCV: 91.7 fL (ref 80.0–100.0)
Platelets: 193 10*3/uL (ref 150–400)
RBC: 5.29 MIL/uL — ABNORMAL HIGH (ref 3.87–5.11)
RDW: 13.1 % (ref 11.5–15.5)
WBC: 7.4 10*3/uL (ref 4.0–10.5)
nRBC: 0 % (ref 0.0–0.2)

## 2017-12-13 LAB — COMPREHENSIVE METABOLIC PANEL
ALBUMIN: 3.9 g/dL (ref 3.5–5.0)
ALK PHOS: 81 U/L (ref 38–126)
ALT: 18 U/L (ref 0–44)
AST: 20 U/L (ref 15–41)
Anion gap: 11 (ref 5–15)
BILIRUBIN TOTAL: 1.3 mg/dL — AB (ref 0.3–1.2)
BUN: 12 mg/dL (ref 8–23)
CALCIUM: 9.3 mg/dL (ref 8.9–10.3)
CO2: 25 mmol/L (ref 22–32)
Chloride: 100 mmol/L (ref 98–111)
Creatinine, Ser: 0.91 mg/dL (ref 0.44–1.00)
GFR calc Af Amer: >60 mL/min (ref >60)
GFR calc non Af Amer: >60 mL/min (ref >60)
GLUCOSE: 152 mg/dL — AB (ref 70–99)
POTASSIUM: 3.8 mmol/L (ref 3.5–5.1)
Sodium: 136 mmol/L (ref 135–145)
TOTAL PROTEIN: 7.5 g/dL (ref 6.5–8.1)

## 2017-12-13 LAB — LIPASE, BLOOD: Lipase: 21 U/L (ref 11–51)

## 2017-12-13 MED ORDER — ONDANSETRON HCL 4 MG/2ML IJ SOLN: 4.0000 mg | Freq: Once | INTRAMUSCULAR | Status: DC | PRN

## 2017-12-13 MED ORDER — METOCLOPRAMIDE HCL 10 MG PO TABS: 10.0000 mg | ORAL_TABLET | Freq: Four times a day (QID) | ORAL | 0 refills | Status: DC | PRN

## 2017-12-13 MED ORDER — FAMOTIDINE 20 MG PO TABS: 20.0000 mg | ORAL_TABLET | Freq: Two times a day (BID) | ORAL | 0 refills | Status: DC

## 2017-12-13 MED ORDER — LACTATED RINGERS IV BOLUS
1000.0000 mL | Freq: Once | INTRAVENOUS | Status: AC
  Administered 2017-12-13: 1000 mL via INTRAVENOUS

## 2017-12-13 MED ORDER — FAMOTIDINE IN NACL 20-0.9 MG/50ML-% IV SOLN
20.0000 mg | Freq: Once | INTRAVENOUS | Status: AC
  Administered 2017-12-13: 20 mg via INTRAVENOUS
  Filled 2017-12-13: qty 50

## 2017-12-13 MED ORDER — METOCLOPRAMIDE HCL 5 MG/ML IJ SOLN
10.0000 mg | Freq: Once | INTRAMUSCULAR | Status: AC
  Administered 2017-12-13: 10 mg via INTRAVENOUS
  Filled 2017-12-13: qty 2

## 2017-12-13 MED ORDER — SUCRALFATE 1 GM/10ML PO SUSP
1.0000 g | Freq: Three times a day (TID) | ORAL | Status: DC
  Filled 2017-12-13: qty 10

## 2017-12-13 NOTE — ED Triage Notes (Signed)
Hx of diverticulitis.  Pt has upper abd pain and vomiting since Saturday night.

## 2017-12-13 NOTE — ED Provider Notes (Signed)
Emergency Department Provider Note   I have reviewed the triage vital signs and the nursing notes.   HISTORY  Chief Complaint Emesis   HPI SHIANE WENBERG is a 72 y.o. female with a history of reflux and peptic ulcer disease recently diagnosed with diverticulitis started on antibiotics which seemed to improve some symptoms for a little bit but then subsequently got worse again she saw her primary doctor who extended the antibiotics for another week.  Since that time she has had worsening epigastric discomfort with increased belching and pain worse after eating.  Has vomited multiple times.  Zofran at home does not seem to help.  Comes here for evaluation.  Does not have any pain one particular spot at this point.  No diarrhea or constipation. No other associated or modifying symptoms.    Past Medical History:  Diagnosis Date  . Anxiety   . Depression   . GERD (gastroesophageal reflux disease)   . Irritable bowel syndrome   . Osteoarthritis     Patient Active Problem List   Diagnosis Date Noted  . Coronary artery calcification   . Rectal bleeding 02/14/2012  . COLONIC POLYPS, HYPERPLASTIC 01/09/2009  . DIVERTICULOSIS, COLON 01/09/2009  . CONSTIPATION, CHRONIC 01/09/2009  . PEPTIC ULCER, ACUTE, HEMORRHAGE, HX OF 01/09/2009  . MEMORY LOSS 07/27/2007  . ANEMIA-NOS 06/29/2007  . ANXIETY 06/29/2007  . DEPRESSION 06/29/2007  . HYPERTENSION 06/29/2007  . VARICOSE VEINS LOWER EXTREMITIES W/INFLAMMATION 06/29/2007  . Dyspnea 06/29/2007  . GERD 06/29/2007  . PEPTIC ULCER DISEASE 06/29/2007  . RECTOCELE WITHOUT MENTION OF UTERINE PROLAPSE 06/29/2007  . ARTHRITIS 06/29/2007  . LOW BACK PAIN 06/29/2007  . ABDOMINAL PAIN, LEFT LOWER QUADRANT 06/29/2007  . CATARACT, HX OF 06/29/2007    Past Surgical History:  Procedure Laterality Date  . ABDOMINAL HYSTERECTOMY  1984  . APPENDECTOMY    . CHOLECYSTECTOMY  1974  . COLONOSCOPY  08/03/2007   RMR: diminutive distal rectal polyps  (2), Left-sided transverse diverticula HYPERPLASTIC POLYPS  . COLONOSCOPY  02/17/2012   [id number] hemorrhoids; probable rectal prolapse-s/p  rectal mucosal biopsy. Colonic polyp-biopsied/Pancolonic diverticulosis. Benign polyp.   . ESOPHAGOGASTRODUODENOSCOPY  08/03/2007   XLK:[id number] benign tubular peptic stricture status post dilation/   Hiatal hernia, erosive/ulcerative esophagitis  . HEMORRHOID SURGERY  2014   Dr.Waters  . LEFT HEART CATH AND CORONARY ANGIOGRAPHY N/A 01/18/2017   Procedure: LEFT HEART CATH AND CORONARY ANGIOGRAPHY;  Surgeon: Jettie Booze, MD;  Location: [facility];  Service: Cardiovascular;  Laterality: N/A;  . PROLAPSED UTERINE FIBROID LIGATION  2010  . RECTOCELE REPAIR      Current Outpatient Rx  . Order #: [id number] Class: Historical Med  . Order #: [id number] Class: Historical Med  . Order #: [id number] Class: Historical Med  . Order #: [id number] Class: Historical Med  . Order #: [id number] Class: Print  . Order #: [id number] Class: Historical Med  . Order #: [id number] Class: Print  . Order #: [id number] Class: Print    Allergies Sulfonamide derivatives  Family History  Problem Relation Age of Onset  . Transient ischemic attack Mother   . Lung cancer Father   . Colon cancer Neg Hx   . Dementia Neg Hx     Social History Social History   Tobacco Use  . Smoking status: Never Smoker  . Smokeless tobacco: Never Used  Substance Use Topics  . Alcohol use: No  . Drug use: No    Review of Systems  All other systems negative except as documented in the HPI.  All pertinent positives and negatives as reviewed in the HPI. ____________________________________________   PHYSICAL EXAM:  VITAL SIGNS: ED Triage Vitals  Enc Vitals Group     BP 12/13/17 1349 (!) 146/74     Pulse Rate 12/13/17 1349 77     Resp 12/13/17 1349 18     Temp 12/13/17 1349 97.8 F (36.6 C)     Temp Source 12/13/17 1349 Oral     SpO2 12/13/17 1349 92 %     Weight 12/13/17  1350 206 lb (93.4 kg)     Height 12/13/17 1350 5\' 3"  (1.6 m)     Head Circumference --      Peak Flow --      Pain Score 12/13/17 1350 7     Pain Loc --      Pain Edu? --      Excl. in [place]? --     Constitutional: Alert and oriented. Well appearing and in no acute distress. Eyes: Conjunctivae are normal. PERRL. EOMI. Head: Atraumatic. Nose: No congestion/rhinnorhea. Mouth/Throat: Mucous membranes are moist.  Oropharynx non-erythematous. Neck: No stridor.  No meningeal signs.   Cardiovascular: Normal rate, regular rhythm. Good peripheral circulation. Grossly normal heart sounds.   Respiratory: Normal respiratory effort.  No retractions. Lungs CTAB. Gastrointestinal: Soft and nontender. No distention.  Musculoskeletal: No lower extremity tenderness nor edema. No gross deformities of extremities. Neurologic:  Normal speech and language. No gross focal neurologic deficits are appreciated.  Skin:  Skin is warm, dry and intact. No rash noted.   ____________________________________________   LABS (all labs ordered are listed, but only abnormal results are displayed)  Labs Reviewed  COMPREHENSIVE METABOLIC PANEL - Abnormal; Notable for the following components:      Result Value   Glucose, Bld 152 (*)    Total Bilirubin 1.3 (*)    All other components within normal limits  CBC - Abnormal; Notable for the following components:   RBC 5.29 (*)    Hemoglobin 15.4 (*)    HCT 48.5 (*)    All other components within normal limits  LIPASE, BLOOD  H. PYLORI ANTIBODY, IGG   ____________________________________________  EKG   EKG Interpretation  Date/Time:    Ventricular Rate:    PR Interval:    QRS Duration:   QT Interval:    QTC Calculation:   R Axis:     Text Interpretation:         ____________________________________________  RADIOLOGY  No results found.  ____________________________________________   PROCEDURES  Procedure(s) performed:    Procedures   ____________________________________________   INITIAL IMPRESSION / ASSESSMENT AND PLAN / ED COURSE  Patient had multiple CT scans of the same symptoms that are improving.  I think she needs an EGD but does have a follow-up appointment with gastroenterology until the middle of December.  I discussed with Dr. Gala Romney who will try to get her in sooner.  We will increase her antiacids by adding a histamine blocker.     Pertinent labs & imaging results that were available during my care of the patient were reviewed by me and considered in my medical decision making (see chart for details).  ____________________________________________  FINAL CLINICAL IMPRESSION(S) / ED DIAGNOSES  Final diagnoses:  Nausea and vomiting, intractability of vomiting not specified, unspecified vomiting type     MEDICATIONS GIVEN DURING THIS VISIT:  Medications  metoCLOPramide (REGLAN) injection 10 mg (10 mg Intravenous Given 12/13/17 1510)  lactated ringers bolus 1,000 mL (0 mLs Intravenous Stopped 12/13/17  1725)  famotidine (PEPCID) IVPB 20 mg premix (0 mg Intravenous Stopped 12/13/17 1554)     NEW OUTPATIENT MEDICATIONS STARTED DURING THIS VISIT:  Discharge Medication List as of 12/13/2017  5:53 PM    START taking these medications   Details  famotidine (PEPCID) 20 MG tablet Take 1 tablet (20 mg total) by mouth 2 (two) times daily., Starting Mon 12/13/2017, Print    metoCLOPramide (REGLAN) 10 MG tablet Take 1 tablet (10 mg total) by mouth every 6 (six) hours as needed for nausea (nausea/headache)., Starting Mon 12/13/2017, Print        Note:  This note was prepared with assistance of Dragon voice recognition software. Occasional wrong-word or sound-a-like substitutions may have occurred due to the inherent limitations of voice recognition software.   Merrily Pew, MD 12/13/17 2151

## 2017-12-13 NOTE — ED Notes (Signed)
Pt placed on 2 l o2 via [place].  While sleeping pt's sats dropped to 88%on ra.  Pt states she has had problems with her o2 dropping and has an appointment with her lung dr soon.

## 2017-12-14 LAB — H. PYLORI ANTIBODY, IGG: H PYLORI IGG: 0.42 {index_val} (ref 0.00–0.79)

## 2018-02-18 ENCOUNTER — Ambulatory Visit: Payer: Medicare HMO | Admitting: Gastroenterology

## 2018-02-21 ENCOUNTER — Encounter

## 2018-02-21 ENCOUNTER — Ambulatory Visit: Payer: Medicare HMO | Admitting: Gastroenterology

## 2018-02-21 ENCOUNTER — Encounter: Payer: Self-pay | Admitting: Gastroenterology

## 2018-02-21 DIAGNOSIS — K5732 Diverticulitis of large intestine without perforation or abscess without bleeding: Secondary | ICD-10-CM | POA: Insufficient documentation

## 2018-02-21 DIAGNOSIS — R198 Other specified symptoms and signs involving the digestive system and abdomen: Secondary | ICD-10-CM | POA: Diagnosis not present

## 2018-02-21 MED ORDER — AMOXICILLIN-POT CLAVULANATE 875-125 MG PO TABS: 1.0000 | ORAL_TABLET | Freq: Three times a day (TID) | ORAL | 0 refills | Status: AC

## 2018-02-21 MED ORDER — ONDANSETRON HCL 4 MG PO TABS: 4.0000 mg | ORAL_TABLET | Freq: Three times a day (TID) | ORAL | 0 refills | Status: DC | PRN

## 2018-02-21 NOTE — Progress Notes (Signed)
Reviewed [facility] records. Patient had Grade III prolapsing hemorrhoids and underwent three column hemorrhoidectomy 10/2012 by Dr. Morton Stall.     FINAL PATHOLOGIC DIAGNOSIS MICROSCOPIC EXAMINATION AND DIAGNOSIS  A. LEFT LATERAL HEMORRHOID, EXCISION: Anal intraepithelial neoplasia 3 (AIN-3; severe squamous epithelial dysplasia). Hemorrhoid. See comment.  B. RIGHT POSTERIOR HEMORRHOID, EXCISION: Hemorrhoid. Acute inflammation. No significant dysplasia.  COMMENT: The left lateral hemorrhoid has severe squamous epithelial dysplasia at the anorectal junction. The dysplastic squamous epithelial cells extend into rectal crypts. The proliferative rate is elevated throughout the thickness of the dysplastic epithelium. The dysplastic epithelium is strongly immunoreactive for p16. The visualized margins of the hemorrhoidectomy specimen appear to be free of dysplasia.

## 2018-02-21 NOTE — Progress Notes (Signed)
Primary Care Physician:  Nickola Major, MD  Primary Gastroenterologist:  Garfield Cornea, MD   Chief Complaint  Patient presents with  . Abdominal Pain    comes/goes  . change in bowels    feels like she needs to go to the bathroom but only has a small amount. Feels like stool is always in rectum  . Nausea    w/ vomiting periodically  . Weight Loss    sept weight 225lbs.    HPI:  Elaine Alvarez is a 72 y.o. female here at the request of Dr. Terrill Mohr for further evaluation of abdominal pain, diverticulitis.  Patient last seen by our practice in August 2014.  She was having persistent rectal bleeding at that time.  Colonoscopy in December 2013 showed internal hemorrhoids and probable rectal prolapse.  Benign colon polyp removed.  Also with pancolonic diverticulosis.  Patient started having problems with her stomach back in September.  She started having lower abdominal pain and change in bowel habits.  Seen in the ED at [facility] on 11/08/2017 with lower abdominal pain, right flank pain and vomiting.  She had a CT renal protocol showed left colonic diverticulosis.  Mild mucosal thickening of the sigmoid colon without significant pericolonic inflammatory changes may represent chronic diverticulitis.  Hemoglobin at that time was 15.4, white blood cell count normal at 5700.  Saw her PCP on September 23 for follow-up.  Pain in the abdomen returned and actually worsening since completing antibiotics (Cipro and Flagyl).  She was given another course of Cipro and Flagyl on September 23.  She had a repeat CT scan with contrast 3 days later any evidence of diverticulitis.  She reports that her symptoms always improved on antibiotics but never completely went away.  Some days better than others.  Continues to have lower abdominal pain.  Stools range from [facility] 4-7.  If she has solid stool she passes only a small amount at a time.  She has loose stools and she may have large volume with  incontinence.  Some intermittent rectal bleeding.  No melena.  No urinary symptoms.  Having a lot of trouble trying to clean up after bowel movement.  She reports a poor appetite.  No longer having vomiting.  She states that her weight was up to 225 pounds in September and she is down to 208 today.  According to epic her weight was 209 in September, 206 in October and now up to 208 today.   Since her 2014 evaluation by she reports having hemorrhoid surgery with Dr. Morton Stall at [facility].  States she was supposed to go back for yearly follow-ups but she was unable to get there due to lack of transportation.  Also saw her gynecologist for management of rectal prolapse.     She reports having leftover Flagyl, she believes she was not taking it 3 times daily every day.  She has about 5 left.  Current Outpatient Medications  Medication Sig Dispense Refill  . acetaminophen (TYLENOL) 500 MG tablet Take 1,000 mg every 6 (six) hours as needed by mouth for moderate pain or headache.    . diazepam (VALIUM) 10 MG tablet Take 5-10 mg by mouth See admin instructions. 5mg  in the morning and 10mg  at bedtime    . esomeprazole (NEXIUM) 20 MG capsule Take 20 mg daily by mouth.     . metroNIDAZOLE (FLAGYL) 500 MG tablet Take 500 mg by mouth 3 (three) times daily.    . multivitamin-iron-minerals-folic acid (CENTRUM)  chewable tablet Chew 1 tablet by mouth daily.    . sertraline (ZOLOFT) 100 MG tablet Take 200 mg by mouth daily.     No current facility-administered medications for this visit.     Allergies as of 02/21/2018 - Review Complete 02/21/2018  Allergen Reaction Noted  . Sulfonamide derivatives Rash     Past Medical History:  Diagnosis Date  . Anxiety   . Depression   . GERD (gastroesophageal reflux disease)   . Irritable bowel syndrome   . Osteoarthritis     Past Surgical History:  Procedure Laterality Date  . ABDOMINAL HYSTERECTOMY  1984  . APPENDECTOMY    . CHOLECYSTECTOMY  1974  . COLONOSCOPY   08/03/2007   RMR: diminutive distal rectal polyps (2), Left-sided transverse diverticula HYPERPLASTIC POLYPS  . COLONOSCOPY  02/17/2012   QTM:[id number] hemorrhoids; probable rectal prolapse-s/p  rectal mucosal biopsy. Colonic polyp-biopsied/Pancolonic diverticulosis. Benign polyp.   . 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 HEMORRHOID SURGERY  2014   Dr.Waters  . LEFT HEART CATH AND CORONARY ANGIOGRAPHY N/A 01/18/2017   Procedure: LEFT HEART CATH AND CORONARY ANGIOGRAPHY;  Surgeon: Jettie Booze, MD;  Location: [place] [facility];  Service: Cardiovascular;  Laterality: N/A;  . PROLAPSED UTERINE FIBROID LIGATION  2010  . RECTOCELE REPAIR      Family History  Problem Relation Age of Onset  . Transient ischemic attack Mother   . Lung cancer Father   . Colon cancer Neg Hx   . Dementia Neg Hx     Social History   Socioeconomic History  . Marital status: Widowed    Spouse name: R  . Number of children: 4  . Years of education: College 2y  . Highest education level: Not on file  Occupational History  . Occupation: retired    Fish farm manager: RETIRED  Social Needs  . Financial resource strain: Not on file  . Food insecurity:    Worry: Not on file    Inability: Not on file  . Transportation needs:    Medical: Not on file    Non-medical: Not on file  Tobacco Use  . Smoking status: Never Smoker  . Smokeless tobacco: Never Used  Substance and Sexual Activity  . Alcohol use: No  . Drug use: No  . Sexual activity: Not on file  Lifestyle  . Physical activity:    Days per week: Not on file    Minutes per session: Not on file  . Stress: Not on file  Relationships  . Social connections:    Talks on phone: Not on file    Gets together: Not on file    Attends religious service: Not on file    Active member of club or organization: Not on file    Attends meetings of  clubs or organizations: Not on file    Relationship status: Not on file  . Intimate partner violence:    Fear of current or ex partner: Not on file    Emotionally abused: Not on file    Physically abused: Not on file    Forced sexual activity: Not on file  Other Topics Concern  . Not on file  Social History Narrative   Lives at home with husband (R) and daughter [name])   She is retired.   Has 4 children.    Caffeine use: Large cup of coffee/day and 4 sodas  ROS:  General: Negative for  fever, chills, fatigue, weakness. + anorexia. She reports 20 pound weight loss but not documented in epic. Eyes: Negative for vision changes.  ENT: Negative for hoarseness, difficulty swallowing , nasal congestion. CV: Negative for chest pain, angina, palpitations, dyspnea on exertion, peripheral edema.  Respiratory: Negative for dyspnea at rest, dyspnea on exertion, cough, sputum, wheezing.  GI: See history of present illness. GU:  Negative for dysuria, hematuria, urinary incontinence, urinary frequency, nocturnal urination.  MS: Negative for joint pain, low back pain.  Derm: Negative for rash or itching.  Neuro: Negative for weakness, abnormal sensation, seizure, frequent headaches, memory loss, confusion.  Psych: Negative for anxiety, depression, suicidal ideation, hallucinations.  Endo: Negative for unusual weight change.  Heme: Negative for bruising or bleeding. Allergy: Negative for rash or hives.    Physical Examination:  BP 134/81   Pulse 73   Temp (!) 97 F (36.1 C) (Oral)   Ht 5\' 3"  (1.6 m)   Wt 208 lb 12.8 oz (94.7 kg)   BMI 36.99 kg/m    General: Well-nourished, well-developed in no acute distress.  Head: Normocephalic, atraumatic.   Eyes: Conjunctiva pink, no icterus. Mouth: Oropharyngeal mucosa moist and pink , no lesions erythema or exudate. Neck: Supple without thyromegaly, masses, or lymphadenopathy.  Lungs: Clear to auscultation bilaterally.  Heart: Regular  rate and rhythm, no murmurs rubs or gallops.  Abdomen: Bowel sounds are normal, nondistended, no hepatosplenomegaly or masses, no abdominal bruits or    hernia , no rebound or guarding.  Moderate suprapubic and llq tenderness Rectal: not performed Extremities: No lower extremity edema. No clubbing or deformities.  Neuro: Alert and oriented x 4 , grossly normal neurologically.  Skin: Warm and dry, no rash or jaundice.   Psych: Alert and cooperative, normal mood and affect.  Labs: Lab Results  Component Value Date   LIPASE 21 12/13/2017   Lab Results  Component Value Date   CREATININE 0.91 12/13/2017   BUN 12 12/13/2017   NA 136 12/13/2017   K 3.8 12/13/2017   CL 100 12/13/2017   CO2 25 12/13/2017   Lab Results  Component Value Date   ALT 18 12/13/2017   AST 20 12/13/2017   ALKPHOS 81 12/13/2017   BILITOT 1.3 (H) 12/13/2017   Lab Results  Component Value Date   WBC 7.4 12/13/2017   HGB 15.4 (H) 12/13/2017   HCT 48.5 (H) 12/13/2017   MCV 91.7 12/13/2017   PLT 193 12/13/2017   h pylori igG negative.   Imaging Studies: No results found.

## 2018-02-21 NOTE — Patient Instructions (Signed)
Complete Flagyl.  Start Augmentin one pill three times a day for 10 days.   Zofran sent to pharmacy in case needed for nausea.   Try low fiber diet for the next 10 days until abdominal pain improved.   Call nurse at [phone] and let us know how you are doing once you complete the Augmentin. If still having problems with abdominal pain at that point, you will need repeat CT scan.    Diverticulitis  Diverticulitis is infection or inflammation of small pouches (diverticula) in the colon that form due to a condition called diverticulosis. Diverticula can trap stool (feces) and bacteria, causing infection and inflammation. Diverticulitis may cause severe stomach pain and diarrhea. It may lead to tissue damage in the colon that causes bleeding. The diverticula may also burst (rupture) and cause infected stool to enter other areas of the abdomen. Complications of diverticulitis can include:  Bleeding.  Severe infection.  Severe pain.  Rupture (perforation) of the colon.  Blockage (obstruction) of the colon. What are the causes? This condition is caused by stool becoming trapped in the diverticula, which allows bacteria to grow in the diverticula. This leads to inflammation and infection. What increases the risk? You are more likely to develop this condition if:  You have diverticulosis. The risk for diverticulosis increases if: ? You are overweight or obese. ? You use tobacco products. ? You do not get enough exercise.  You eat a diet that does not include enough fiber. High-fiber foods include fruits, vegetables, beans, nuts, and whole grains. What are the signs or symptoms? Symptoms of this condition may include:  Pain and tenderness in the abdomen. The pain is normally located on the left side of the abdomen, but it may occur in other areas.  Fever and chills.  Bloating.  Cramping.  Nausea.  Vomiting.  Changes in bowel routines.  Blood in your stool. How is this  diagnosed? This condition is diagnosed based on:  Your medical history.  A physical exam.  Tests to make sure there is nothing else causing your condition. These tests may include: ? Blood tests. ? Urine tests. ? Imaging tests of the abdomen, including X-rays, ultrasounds, MRIs, or CT scans. How is this treated? Most cases of this condition are mild and can be treated at home. Treatment may include:  Taking over-the-counter pain medicines.  Following a clear liquid diet.  Taking antibiotic medicines by mouth.  Rest. More severe cases may need to be treated at a hospital. Treatment may include:  Not eating or drinking.  Taking prescription pain medicine.  Receiving antibiotic medicines through an IV tube.  Receiving fluids and nutrition through an IV tube.  Surgery. When your condition is under control, your health care provider may recommend that you have a colonoscopy. This is an exam to look at the entire large intestine. During the exam, a lubricated, bendable tube is inserted into the anus and then passed into the rectum, colon, and other parts of the large intestine. A colonoscopy can show how severe your diverticula are and whether something else may be causing your symptoms. Follow these instructions at home: Medicines  Take over-the-counter and prescription medicines only as told by your health care provider. These include fiber supplements, probiotics, and stool softeners.  If you were prescribed an antibiotic medicine, take it as told by your health care provider. Do not stop taking the antibiotic even if you start to feel better.  Do not drive or use heavy machinery while taking  prescription pain medicine. General instructions   Follow a full liquid diet or another diet as directed by your health care provider. After your symptoms improve, your health care provider may tell you to change your diet. He or she may recommend that you eat a diet that contains at  least 25 g (25 grams) of fiber daily. Fiber makes it easier to pass stool. Healthy sources of fiber include: ? Berries. One cup contains 4-8 grams of fiber. ? Beans or lentils. One half cup contains 5-8 grams of fiber. ? Green vegetables. One cup contains 4 grams of fiber.  Exercise for at least 30 minutes, 3 times each week. You should exercise hard enough to raise your heart rate and break a sweat.  Keep all follow-up visits as told by your health care provider. This is important. You may need a colonoscopy. Contact a health care provider if:  Your pain does not improve.  You have a hard time drinking or eating food.  Your bowel movements do not return to normal. Get help right away if:  Your pain gets worse.  Your symptoms do not get better with treatment.  Your symptoms suddenly get worse.  You have a fever.  You vomit more than one time.  You have stools that are bloody, black, or tarry. Summary  Diverticulitis is infection or inflammation of small pouches (diverticula) in the colon that form due to a condition called diverticulosis. Diverticula can trap stool (feces) and bacteria, causing infection and inflammation.  You are at higher risk for this condition if you have diverticulosis and you eat a diet that does not include enough fiber.  Most cases of this condition are mild and can be treated at home. More severe cases may need to be treated at a hospital.  When your condition is under control, your health care provider may recommend that you have an exam called a colonoscopy. This exam can show how severe your diverticula are and whether something else may be causing your symptoms. This information is not intended to replace advice given to you by your health care provider. Make sure you discuss any questions you have with your health care provider. Document Released: 11/26/2004 Document Revised: 03/21/2016 Document Reviewed: 03/21/2016 Elsevier Interactive Patient  Education  2019 [employer].

## 2018-02-21 NOTE — Assessment & Plan Note (Signed)
Very pleasant 72 year old female with recurrent lower abdominal pain, change in bowel habits which began around September.  Initial noncontrast CT was September 9 with mild mucosal thickening of the sigmoid colon without significant pericolonic inflammatory changes but could represent chronic diverticulitis.  She did receive round of Cipro and Flagyl with some noted benefit but by the time she came off antibiotics her symptoms started returning.  Started a second course of Cipro and Flagyl shortly after, follow-up CT 3 days later on September 26, no obvious changes to suggest diverticulitis.  Patient never seemed to get completely better.  A couple weeks ago symptoms started progressively getting worse, took an additional round of Cipro and Flagyl, has a few Flagyl left at this point.  Significant abdominal pain on exam.  Her last colonoscopy was in 2013.  I received a copy of her hemorrhoid surgery in 2014 by Dr. 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  Patient was supposed to go back for yearly follow-up but never did.  At this point she is noted improvement in abdominal pain with her third course of antibiotics but never gets a clear resolution of symptoms.  I suggested CT scan versus switching antibiotics to Augmentin to see how she does.  If she does not get complete resolution of symptoms with Augmentin then she definitely needs a repeat CT scan.  Ultimately we are looking at a colonoscopy in the near future to follow-up on CT findings, persistent symptoms, rectal pathology.  We will call with a  progress report in 2 weeks.

## 2018-02-22 NOTE — Progress Notes (Signed)
CC'D TO PCP °

## 2018-03-13 ENCOUNTER — Telehealth: Payer: Self-pay | Admitting: Gastroenterology

## 2018-03-13 DIAGNOSIS — R109 Unspecified abdominal pain: Secondary | ICD-10-CM

## 2018-03-13 NOTE — Telephone Encounter (Signed)
Please get progress report on patient. Did her abdominal pain resolved? If so, she needs a colonoscopy with propofol in four weeks. Let me know so I can give orders.  IF she is still having abd pain let me know. Would consider CT.

## 2018-03-14 ENCOUNTER — Other Ambulatory Visit: Payer: Self-pay

## 2018-03-14 DIAGNOSIS — R109 Unspecified abdominal pain: Secondary | ICD-10-CM

## 2018-03-14 NOTE — Telephone Encounter (Signed)
She needs CT A/P with contrast to evaluate her lower abd pain, h/o diverticulitis.  She needs a creatinine beforehand.

## 2018-03-14 NOTE — Telephone Encounter (Signed)
Lmom, waiting on a return call.  

## 2018-03-14 NOTE — Telephone Encounter (Signed)
CT scheduled for 03/25/2018 at 4:00pm, arrival time 3:45pm, npo 4 hours prior, p/u oral contrast w/ instructions.   Called patient and is aware of appt details. She is aware she needs lab work prior.

## 2018-03-14 NOTE — Telephone Encounter (Signed)
Noted. Lab orders placed. Please schedule CT.

## 2018-03-14 NOTE — Addendum Note (Signed)
Addended by: Inge Rise on: 03/14/2018 03:51 PM   Modules accepted: Orders

## 2018-03-14 NOTE — Telephone Encounter (Signed)
Her abdominal pain has come back. She felt a little better while taking the antibiotics and pt said she was going to call today to schedule an appointment due to the pain coming back. She's been off of her antibiotic for 1 week.

## 2018-03-16 ENCOUNTER — Other Ambulatory Visit: Payer: Self-pay

## 2018-03-16 ENCOUNTER — Telehealth: Payer: Self-pay | Admitting: Internal Medicine

## 2018-03-16 DIAGNOSIS — R109 Unspecified abdominal pain: Secondary | ICD-10-CM

## 2018-03-16 NOTE — Telephone Encounter (Signed)
Labs faxed to [facility]

## 2018-03-16 NOTE — Telephone Encounter (Signed)
[facility] [address] called to say that they needed the lab orders on patient. Fax to [phone]

## 2018-03-17 LAB — CREATININE, SERUM
Creatinine, Ser: 0.8 mg/dL (ref 0.57–1.00)
GFR calc non Af Amer: 74 mL/min/{1.73_m2} (ref >59)
GFR, EST AFRICAN AMERICAN: 85 mL/min/{1.73_m2} (ref >59)

## 2018-03-22 ENCOUNTER — Telehealth: Payer: Self-pay | Admitting: *Deleted

## 2018-03-22 NOTE — Telephone Encounter (Signed)
Melissa from [facility] called stating patient has [employer] and it requires PA for CT abd/pelvis. Once PA is approved, she can be reached at [phone] ext [phone]  PA started on [employer]'s website. Tracking # [id number]

## 2018-03-22 NOTE — Telephone Encounter (Signed)
PA was approved. Auth# [id number] dates 03/22/2018-04/21/2018.

## 2018-03-25 ENCOUNTER — Encounter (HOSPITAL_COMMUNITY): Payer: Self-pay | Admitting: Radiology

## 2018-03-25 ENCOUNTER — Ambulatory Visit (HOSPITAL_COMMUNITY)
Admission: RE | Admit: 2018-03-25 | Discharge: 2018-03-25 | Disposition: A | Discharge status: Home / Self Care | Payer: Medicare HMO | Source: Ambulatory Visit | Attending: Gastroenterology | Admitting: Gastroenterology

## 2018-03-25 DIAGNOSIS — R109 Unspecified abdominal pain: Secondary | ICD-10-CM | POA: Insufficient documentation

## 2018-03-25 MED ORDER — IOPAMIDOL (ISOVUE-300) INJECTION 61%
100.0000 mL | Freq: Once | INTRAVENOUS | Status: AC | PRN
  Administered 2018-03-25: 100 mL via INTRAVENOUS

## 2018-03-30 ENCOUNTER — Telehealth: Payer: Self-pay | Admitting: Internal Medicine

## 2018-03-30 NOTE — Telephone Encounter (Signed)
Pt had CT done on Friday and was calling for results. I told her it would take 7-10 business days, but I would let the nurse know she had called.

## 2018-03-30 NOTE — Telephone Encounter (Signed)
Pt was given results this morning.

## 2018-03-31 ENCOUNTER — Emergency Department (HOSPITAL_COMMUNITY)
Admission: EM | Admit: 2018-03-31 | Discharge: 2018-03-31 | Disposition: A | Discharge status: Home / Self Care | Payer: Medicare HMO | Attending: Emergency Medicine | Admitting: Emergency Medicine

## 2018-03-31 ENCOUNTER — Emergency Department (HOSPITAL_COMMUNITY): Payer: Medicare HMO

## 2018-03-31 ENCOUNTER — Other Ambulatory Visit: Payer: Self-pay

## 2018-03-31 ENCOUNTER — Encounter (HOSPITAL_COMMUNITY): Payer: Self-pay | Admitting: Emergency Medicine

## 2018-03-31 DIAGNOSIS — S0990XA Unspecified injury of head, initial encounter: Secondary | ICD-10-CM | POA: Diagnosis present

## 2018-03-31 DIAGNOSIS — Y999 Unspecified external cause status: Secondary | ICD-10-CM | POA: Diagnosis not present

## 2018-03-31 DIAGNOSIS — S022XXA Fracture of nasal bones, initial encounter for closed fracture: Secondary | ICD-10-CM | POA: Diagnosis not present

## 2018-03-31 DIAGNOSIS — W010XXA Fall on same level from slipping, tripping and stumbling without subsequent striking against object, initial encounter: Secondary | ICD-10-CM | POA: Insufficient documentation

## 2018-03-31 DIAGNOSIS — W19XXXA Unspecified fall, initial encounter: Secondary | ICD-10-CM

## 2018-03-31 DIAGNOSIS — Y93K1 Activity, walking an animal: Secondary | ICD-10-CM | POA: Insufficient documentation

## 2018-03-31 DIAGNOSIS — Z23 Encounter for immunization: Secondary | ICD-10-CM | POA: Diagnosis not present

## 2018-03-31 DIAGNOSIS — Y929 Unspecified place or not applicable: Secondary | ICD-10-CM | POA: Diagnosis not present

## 2018-03-31 MED ORDER — HYDROCODONE-ACETAMINOPHEN 5-325 MG PO TABS
1.0000 | ORAL_TABLET | Freq: Once | ORAL | Status: AC
  Administered 2018-03-31: 1 via ORAL
  Filled 2018-03-31: qty 1

## 2018-03-31 MED ORDER — HYDROCODONE-ACETAMINOPHEN 5-325 MG PO TABS: 1.0000 | ORAL_TABLET | ORAL | 0 refills | Status: AC | PRN

## 2018-03-31 MED ORDER — TETANUS-DIPHTH-ACELL PERTUSSIS 5-2.5-18.5 LF-MCG/0.5 IM SUSP
0.5000 mL | Freq: Once | INTRAMUSCULAR | Status: AC
  Administered 2018-03-31: 0.5 mL via INTRAMUSCULAR
  Filled 2018-03-31: qty 0.5

## 2018-03-31 NOTE — ED Provider Notes (Signed)
[facility] Emergency Department Provider Note MRN:  [id number]  Arrival date & time: 03/31/18     Chief Complaint   Fall   History of Present Illness   Elaine Alvarez is a 73 y.o. year-old female with a history of anxiety, depression, arthritis presenting to the ED with chief complaint of fall.  Patient was walking her dog earlier this morning, the dog's leash became wrapped around 1 of his legs.  Patient bent down to untangle the leash and lost her footing and fell forward onto her face.  Patient thinks that she "almost" lost consciousness.  Denies nausea or vomiting since the fall.  Endorsing facial pain that is worst at the nose, moderate in severity, worse with movement or palpation.  Endorsing mild neck pain as well as mild thoracic back pain, no thoracic back pain is chronic but feels worse than normal since the fall.  Also endorsing right knee pain.  Denies chest pain or shortness of breath, no abdominal pain, does not take blood thinners.  Review of Systems  A complete 10 system review of systems was obtained and all systems are negative except as noted in the HPI and PMH.   Patient's Health History    Past Medical History:  Diagnosis Date  . Anxiety   . Depression   . GERD (gastroesophageal reflux disease)   . Irritable bowel syndrome   . Osteoarthritis     Past Surgical History:  Procedure Laterality Date  . ABDOMINAL HYSTERECTOMY  1984  . APPENDECTOMY    . CHOLECYSTECTOMY  1974  . COLONOSCOPY  08/03/2007   RMR: diminutive distal rectal polyps (2), Left-sided transverse diverticula HYPERPLASTIC POLYPS  . COLONOSCOPY  02/17/2012   OVZ:CHYIFOYD hemorrhoids; probable rectal prolapse-s/p  rectal mucosal biopsy. Colonic polyp-biopsied/Pancolonic diverticulosis. Benign polyp.   . ESOPHAGOGASTRODUODENOSCOPY  08/03/2007   XAJ:OINOM benign tubular peptic stricture status post dilation/   Hiatal hernia, erosive/ulcerative esophagitis  . HEMORRHOID SURGERY   2014   Dr.Waters  . LEFT HEART CATH AND CORONARY ANGIOGRAPHY N/A 01/18/2017   Procedure: LEFT HEART CATH AND CORONARY ANGIOGRAPHY;  Surgeon: Jettie Booze, MD;  Location: [facility];  Service: Cardiovascular;  Laterality: N/A;  . PROLAPSED UTERINE FIBROID LIGATION  2010  . RECTOCELE REPAIR      Family History  Problem Relation Age of Onset  . Transient ischemic attack Mother   . Lung cancer Father   . Colon cancer Neg Hx   . Dementia Neg Hx     Social History   Socioeconomic History  . Marital status: Widowed    Spouse name: R  . Number of children: 4  . Years of education: College 2y  . Highest education level: Not on file  Occupational History  . Occupation: retired    Fish farm manager: RETIRED  Social Needs  . Financial resource strain: Not on file  . Food insecurity:    Worry: Not on file    Inability: Not on file  . Transportation needs:    Medical: Not on file    Non-medical: Not on file  Tobacco Use  . Smoking status: Never Smoker  . Smokeless tobacco: Never Used  Substance and Sexual Activity  . Alcohol use: No  . Drug use: No  . Sexual activity: Not on file  Lifestyle  . Physical activity:    Days per week: Not on file    Minutes per session: Not on file  . Stress: Not on file  Relationships  .  Social connections:    Talks on phone: Not on file    Gets together: Not on file    Attends religious service: Not on file    Active member of club or organization: Not on file    Attends meetings of clubs or organizations: Not on file    Relationship status: Not on file  . Intimate partner violence:    Fear of current or ex partner: Not on file    Emotionally abused: Not on file    Physically abused: Not on file    Forced sexual activity: Not on file  Other Topics Concern  . Not on file  Social History Narrative   Lives at home with husband (R) and daughter [name])   She is retired.   Has 4 children.    Caffeine use: Large cup of coffee/day and 4  sodas     Physical Exam  Vital Signs and Nursing Notes reviewed Vitals:   03/31/18 1230 03/31/18 1413  BP: (!) 170/73 (!) 146/94  Pulse: (!) 50 (!) 53  Resp:  18  Temp:  98.3 F (36.8 C)  SpO2: 93% 94%    CONSTITUTIONAL: Well-appearing, NAD NEURO:  Alert and oriented x 3, no focal deficits EYES:  eyes equal and reactive ENT/NECK:  no LAD, no JVD; swelling to the nose with overlying superficial abrasion; small amount of blood in the right nare, no septal hematoma CARDIO: Regular rate, well-perfused, normal S1 and S2 PULM:  CTAB no wheezing or rhonchi GI/GU:  normal bowel sounds, non-distended, non-tender MSK/SPINE:  No gross deformities, no edema; mild midline tenderness to palpation to the cervical and thoracic spine SKIN:  no rash, atraumatic PSYCH:  Appropriate speech and behavior  Diagnostic and Interventional Summary    Labs Reviewed - No data to display  CT HEAD WO CONTRAST  Final Result    CT CERVICAL SPINE WO CONTRAST  Final Result    CT MAXILLOFACIAL WO CONTRAST  Final Result    DG Knee Complete 4 Views Right  Final Result    DG Thoracic Spine 2 View  Final Result      Medications  HYDROcodone-acetaminophen (NORCO/VICODIN) 5-325 MG per tablet 1 tablet (1 tablet Oral Given 03/31/18 1233)  Tdap (BOOSTRIX) injection 0.5 mL (0.5 mLs Intramuscular Given 03/31/18 1234)     Procedures Critical Care  ED Course and Medical Decision Making  I have reviewed the triage vital signs and the nursing notes.  Pertinent labs & imaging results that were available during my care of the patient were reviewed by me and considered in my medical decision making (see below for details).  CTs to evaluate for facial, intracranial, cervical injury.  X-rays to evaluate for bony injuries to the knee, thoracic spine.  Imaging largely unremarkable with the exception of a nasal bone fracture.  Tetanus updated.  Patient is able to move air through both nostrils, no septal hematoma, no  further intervention needed at this point.  Provided with contact information for ENT if she is concerned about the healing of the nose.  After the discussed management above, the patient was determined to be safe for discharge.  The patient was in agreement with this plan and all questions regarding their care were answered.  ED return precautions were discussed and the patient will return to the ED with any significant worsening of condition.  Barth Kirks. Sedonia Small, MD [place] [email or web address]  Final Clinical Impressions(s) / ED Diagnoses  ICD-10-CM   1. Fall, initial encounter W19.XXXA   2. Closed fracture of nasal bone, initial encounter S02.2XXA     ED Discharge Orders         Ordered    HYDROcodone-acetaminophen (NORCO/VICODIN) 5-325 MG tablet  Every 4 hours PRN     03/31/18 1403             Maudie Flakes, MD 03/31/18 [phone]

## 2018-03-31 NOTE — ED Notes (Signed)
Pt to radiology at this time.

## 2018-03-31 NOTE — ED Triage Notes (Signed)
Pt was walking dog this morning.  Pt bent over, tripping and fell face down on concrete. C collar in place. Complaining of nose, facial pain and right knee pain.

## 2018-03-31 NOTE — Discharge Instructions (Addendum)
You were evaluated in the Emergency Department and after careful evaluation, we did not find any emergent condition requiring admission or further testing in the hospital.  Your symptoms today seem to be due to bruising from the fall.  You did break your nose but it will likely heal well on its own.  In 2 or 3 weeks, if you feel that the nose is not healing well or is crooked, you can call the ear nose and throat specialist provided.  Use Tylenol or ibuprofen during the day for pain.  You can use the Norco medication provided if you are having trouble with sleeping at night due to the pain.  Please return to the Emergency Department if you experience any worsening of your condition.  We encourage you to follow up with a primary care provider.  Thank you for allowing [place] to be a part of your care.

## 2018-04-05 ENCOUNTER — Telehealth: Payer: Self-pay | Admitting: Internal Medicine

## 2018-04-05 NOTE — Telephone Encounter (Signed)
Magda Paganini, did you want this pt to have a tcs now?

## 2018-04-05 NOTE — Telephone Encounter (Signed)
Pt had OV with [place] on 12/23 and had CT done last month. She said she went to her PCP today and was told to call us and schedule her colonoscopy. Does she need another OV or can she have a NV? Please advise. [phone]

## 2018-04-06 ENCOUNTER — Other Ambulatory Visit: Payer: Self-pay

## 2018-04-06 DIAGNOSIS — K625 Hemorrhage of anus and rectum: Secondary | ICD-10-CM

## 2018-04-06 DIAGNOSIS — R198 Other specified symptoms and signs involving the digestive system and abdomen: Secondary | ICD-10-CM

## 2018-04-06 MED ORDER — PEG 3350-KCL-NA BICARB-NACL 420 G PO SOLR: 4000.0000 mL | ORAL | 0 refills | Status: AC

## 2018-04-06 NOTE — Telephone Encounter (Signed)
Called pt back and informed her of pre-op appt 05/26/18 at 2:15pm. Letter mailed with procedure instructions and OV appt letter.

## 2018-04-06 NOTE — Telephone Encounter (Signed)
Schedule colonoscopy for rectal bleeding, change in bowels. Needs propofol due to meds.   Once you have her scheduled, let's bring her in for OV to update physical, meds, etc. Can use urgent.

## 2018-04-06 NOTE — Telephone Encounter (Signed)
Called pt, TCS w/Propofol w/RMR scheduled for 06/02/18 at 10:45am. Rx for prep sent to pharmacy. Orders entered.   OV scheduled for 05/25/18 to update H&P.

## 2018-05-20 ENCOUNTER — Ambulatory Visit (INDEPENDENT_AMBULATORY_CARE_PROVIDER_SITE_OTHER): Payer: Medicare HMO | Admitting: Gastroenterology

## 2018-05-20 ENCOUNTER — Other Ambulatory Visit: Payer: Self-pay

## 2018-05-20 ENCOUNTER — Encounter: Payer: Self-pay | Admitting: Gastroenterology

## 2018-05-20 VITALS — BP 122/76 | HR 61 | Temp 97.1°F | Ht 64.0 in | Wt 210.6 lb

## 2018-05-20 DIAGNOSIS — K625 Hemorrhage of anus and rectum: Secondary | ICD-10-CM | POA: Diagnosis not present

## 2018-05-20 DIAGNOSIS — R1032 Left lower quadrant pain: Secondary | ICD-10-CM

## 2018-05-20 DIAGNOSIS — R198 Other specified symptoms and signs involving the digestive system and abdomen: Secondary | ICD-10-CM

## 2018-05-20 DIAGNOSIS — K5732 Diverticulitis of large intestine without perforation or abscess without bleeding: Secondary | ICD-10-CM

## 2018-05-20 MED ORDER — ONDANSETRON HCL 4 MG PO TABS: 4.0000 mg | ORAL_TABLET | Freq: Three times a day (TID) | ORAL | 0 refills | Status: AC | PRN

## 2018-05-20 MED ORDER — AMOXICILLIN-POT CLAVULANATE 875-125 MG PO TABS: 1.0000 | ORAL_TABLET | Freq: Three times a day (TID) | ORAL | 0 refills | Status: AC

## 2018-05-20 MED ORDER — DICYCLOMINE HCL 10 MG PO CAPS: ORAL_CAPSULE | ORAL | 0 refills | Status: DC

## 2018-05-20 NOTE — Assessment & Plan Note (Signed)
Pleasant 73 year old female initially presenting to get her colonoscopy scheduled, for rectal bleeding, change in bowel habits.  She has a history of diverticulitis involving the sigmoid colon.  Noted on CT imaging last year however CT in January showed no evidence of persistent diverticulitis.  Approximately 10 days ago she started having progressive left lower quadrant abdominal pain associated with urge to have a BM all the time, frequent small stools, "pus" in her stool and bright red blood per rectum (persist but on occasions).  Currently on the schedule for April 2 to have a colonoscopy.  Given current symptoms, will empirically treat for diverticulitis.  Patient was not interested in pursuing CT at the moment.  Will likely push out her colonoscopy into May.  She is quite anxious because she fears having underlying malignancy although the CT is somewhat reassuring.  Augmentin 875 mg 3 times daily for 10 days.  Back down to soft diet, low residue.  Zofran as needed.  Low-dose Bentyl twice daily as needed abdominal pain, hold for constipation.  To discuss timing of colonoscopy with Dr. Gala Romney.   Warning symptoms explained to patient for which she should call or call provider or our office, present to the ED etc.

## 2018-05-20 NOTE — Patient Instructions (Addendum)
1. Start Augmentin one tablet every 8 hours for 10 days for diverticulitis.  2. Zofran one tablet every 8 hours as needed for nausea. 3. After you have been on Augmentin for few days, start Bentyl one capsule one to two times daily for pain. HOLD for constipation.  4. We have reserved a new spot for colonoscopy on May 14th but also keeping the one for 4/2. I am going to speak with Dr. Gala Romney before moving your colonoscopy. We will be in touch.  5. For the next few days, please eat a very soft bland diet, NO significant fiber while you are having pain.  6. For worsening abdominal pain, fever, vomiting please go to the ER. If you have any questions or concerns of an urgent nature, you can call oncall provider after hours at [phone].    Diverticulitis  Diverticulitis is infection or inflammation of small pouches (diverticula) in the colon that form due to a condition called diverticulosis. Diverticula can trap stool (feces) and bacteria, causing infection and inflammation. Diverticulitis may cause severe stomach pain and diarrhea. It may lead to tissue damage in the colon that causes bleeding. The diverticula may also burst (rupture) and cause infected stool to enter other areas of the abdomen. Complications of diverticulitis can include:  Bleeding.  Severe infection.  Severe pain.  Rupture (perforation) of the colon.  Blockage (obstruction) of the colon. What are the causes? This condition is caused by stool becoming trapped in the diverticula, which allows bacteria to grow in the diverticula. This leads to inflammation and infection. What increases the risk? You are more likely to develop this condition if:  You have diverticulosis. The risk for diverticulosis increases if: ? You are overweight or obese. ? You use tobacco products. ? You do not get enough exercise.  You eat a diet that does not include enough fiber. High-fiber foods include fruits, vegetables, beans, nuts, and  whole grains. What are the signs or symptoms? Symptoms of this condition may include:  Pain and tenderness in the abdomen. The pain is normally located on the left side of the abdomen, but it may occur in other areas.  Fever and chills.  Bloating.  Cramping.  Nausea.  Vomiting.  Changes in bowel routines.  Blood in your stool. How is this diagnosed? This condition is diagnosed based on:  Your medical history.  A physical exam.  Tests to make sure there is nothing else causing your condition. These tests may include: ? Blood tests. ? Urine tests. ? Imaging tests of the abdomen, including X-rays, ultrasounds, MRIs, or CT scans. How is this treated? Most cases of this condition are mild and can be treated at home. Treatment may include:  Taking over-the-counter pain medicines.  Following a clear liquid diet.  Taking antibiotic medicines by mouth.  Rest. More severe cases may need to be treated at a hospital. Treatment may include:  Not eating or drinking.  Taking prescription pain medicine.  Receiving antibiotic medicines through an IV tube.  Receiving fluids and nutrition through an IV tube.  Surgery. When your condition is under control, your health care provider may recommend that you have a colonoscopy. This is an exam to look at the entire large intestine. During the exam, a lubricated, bendable tube is inserted into the anus and then passed into the rectum, colon, and other parts of the large intestine. A colonoscopy can show how severe your diverticula are and whether something else may be causing your symptoms. Follow these  instructions at home: Medicines  Take over-the-counter and prescription medicines only as told by your health care provider. These include fiber supplements, probiotics, and stool softeners.  If you were prescribed an antibiotic medicine, take it as told by your health care provider. Do not stop taking the antibiotic even if you start  to feel better.  Do not drive or use heavy machinery while taking prescription pain medicine. General instructions   Follow a full liquid diet or another diet as directed by your health care provider. After your symptoms improve, your health care provider may tell you to change your diet. He or she may recommend that you eat a diet that contains at least 25 g (25 grams) of fiber daily. Fiber makes it easier to pass stool. Healthy sources of fiber include: ? Berries. One cup contains 4-8 grams of fiber. ? Beans or lentils. One half cup contains 5-8 grams of fiber. ? Green vegetables. One cup contains 4 grams of fiber.  Exercise for at least 30 minutes, 3 times each week. You should exercise hard enough to raise your heart rate and break a sweat.  Keep all follow-up visits as told by your health care provider. This is important. You may need a colonoscopy. Contact a health care provider if:  Your pain does not improve.  You have a hard time drinking or eating food.  Your bowel movements do not return to normal. Get help right away if:  Your pain gets worse.  Your symptoms do not get better with treatment.  Your symptoms suddenly get worse.  You have a fever.  You vomit more than one time.  You have stools that are bloody, black, or tarry. Summary  Diverticulitis is infection or inflammation of small pouches (diverticula) in the colon that form due to a condition called diverticulosis. Diverticula can trap stool (feces) and bacteria, causing infection and inflammation.  You are at higher risk for this condition if you have diverticulosis and you eat a diet that does not include enough fiber.  Most cases of this condition are mild and can be treated at home. More severe cases may need to be treated at a hospital.  When your condition is under control, your health care provider may recommend that you have an exam called a colonoscopy. This exam can show how severe your  diverticula are and whether something else may be causing your symptoms. This information is not intended to replace advice given to you by your health care provider. Make sure you discuss any questions you have with your health care provider. Document Released: 11/26/2004 Document Revised: 03/21/2016 Document Reviewed: 03/21/2016 Elsevier Interactive Patient Education  2019 [employer].

## 2018-05-20 NOTE — Progress Notes (Signed)
Primary Care Physician:  Nickola Major, MD  Primary Gastroenterologist:  Garfield Cornea, MD   Chief Complaint  Patient presents with  . Abdominal Pain    llq x 1 week getting worse, sharp, stabbing pain  . Nausea    but no vomiting    HPI:  Elaine Alvarez is a 73 y.o. female here to schedule colonoscopy for rectal bleeding, change in bowels.  Patient was seen back in December 2019 for abdominal pain, diverticulitis.  Last colonoscopy was in December 2013, showed internal hemorrhoids and probable rectal prolapse.  Benign colon polyp removed.  She also had pancolonic diverticulosis.  In 2014 she underwent hemorrhoidectomy by Dr. Morton Stall at [facility], pathology revealed anal intraepithelial neoplasia 3, hemorrhoid.  Back in September 2019 started having abdominal pain.  Describes lower abdominal pain with change in bowel habits.  She was seen in the ED at [facility] for right flank pain and vomiting.  CT renal protocol showed left colonic diverticulosis.  Mild mucosal thickening of the sigmoid colon without significant pericolonic inflammatory changes but could represent chronic diverticulitis.  She completed a course of Cipro and Flagyl but her abdominal pain recurred.  She required a second course of Cipro and Flagyl.  Symptoms improved but her pain never really went away.  Some days worse than others.  When I saw her in December she was given Augmentin for 10 days for suspected smoldering diverticulitis.  Additional imaging studies was a CT abdomen pelvis with contrast November 25, 2017 that showed no persistent diverticulitis.  As we saw her she had a CT abdomen pelvis with contrast March 25, 2018 showing colonic diverticulosis, questionable bladder stone.  Patient states that she did start to feel better after January up until the past 10 days.  Started having more abdominal pain, left lower quadrant. Stools look like pus in them. Stools are soft but small amount.  Some brbpr on toilet tissue. Wears pad all the time. Blood on the pad. Several small stools per day, but incomplete. Constantly feels like needs to have a BM but then nothing comes. No fever. No vomiting. Some nausea, zofran helps. Took one two days ago. Only has a few. No heartburn. LLQ pain is sharp. Hurts worse when trying to get up. Pain wakes her up. Pain scale, 6-7.   Tries not to take hydrocodone, prefers tylenol.   Current Outpatient Medications  Medication Sig Dispense Refill  . acetaminophen (TYLENOL) 500 MG tablet Take 1,000 mg every 6 (six) hours as needed by mouth for moderate pain or headache.    . diazepam (VALIUM) 10 MG tablet Take 5 mg by mouth 2 (two) times daily.     Marland Kitchen esomeprazole (NEXIUM) 20 MG capsule Take 20 mg daily by mouth.     Marland Kitchen HYDROcodone-acetaminophen (NORCO/VICODIN) 5-325 MG tablet Take 1 tablet by mouth every 4 (four) hours as needed. (Patient taking differently: Take 1 tablet by mouth every 4 (four) hours as needed for moderate pain. ) 3 tablet 0  . Multiple Vitamins-Minerals (CENTRUM SILVER 50+WOMEN) TABS Take 1 tablet by mouth daily.    . ondansetron (ZOFRAN) 4 MG tablet Take 1 tablet (4 mg total) by mouth every 8 (eight) hours as needed for nausea or vomiting. 20 tablet 0  . sertraline (ZOLOFT) 100 MG tablet Take 200 mg by mouth daily.    Marland Kitchen topiramate (TOPAMAX) 25 MG tablet Take 25 mg by mouth daily.    . polyethylene glycol-electrolytes (TRILYTE) 420 g solution Take  4,000 mLs by mouth as directed. (Patient not taking: Reported on 05/20/2018) 4000 mL 0   No current facility-administered medications for this visit.     Allergies as of 05/20/2018 - Review Complete 05/20/2018  Allergen Reaction Noted  . Sulfonamide derivatives Rash     Past Medical History:  Diagnosis Date  . Anxiety   . Depression   . GERD (gastroesophageal reflux disease)   . Irritable bowel syndrome   . Osteoarthritis     Past Surgical History:  Procedure Laterality Date  .  ABDOMINAL HYSTERECTOMY  1984  . APPENDECTOMY    . CHOLECYSTECTOMY  1974  . COLONOSCOPY  08/03/2007   RMR: diminutive distal rectal polyps (2), Left-sided transverse diverticula HYPERPLASTIC POLYPS  . COLONOSCOPY  02/17/2012   [id number] hemorrhoids; probable rectal prolapse-s/p  rectal mucosal biopsy. Colonic polyp-biopsied/Pancolonic diverticulosis. Benign polyp.   . 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 HEMORRHOID SURGERY  2014   Dr.Waters  . LEFT HEART CATH AND CORONARY ANGIOGRAPHY N/A 01/18/2017   Procedure: LEFT HEART CATH AND CORONARY ANGIOGRAPHY;  Surgeon: Jettie Booze, MD;  Location: [facility];  Service: Cardiovascular;  Laterality: N/A;  . PROLAPSED UTERINE FIBROID LIGATION  2010  . RECTOCELE REPAIR      Family History  Problem Relation Age of Onset  . Transient ischemic attack Mother   . Lung cancer Father   . Colon cancer Neg Hx   . Dementia Neg Hx     Social History   Socioeconomic History  . Marital status: Widowed    Spouse name: R  . Number of children: 4  . Years of education: College 2y  . Highest education level: Not on file  Occupational History  . Occupation: retired    Fish farm manager: RETIRED  Social Needs  . Financial resource strain: Not on file  . Food insecurity:    Worry: Not on file    Inability: Not on file  . Transportation needs:    Medical: Not on file    Non-medical: Not on file  Tobacco Use  . Smoking status: Never Smoker  . Smokeless tobacco: Never Used  Substance and Sexual Activity  . Alcohol use: No  . Drug use: No  . Sexual activity: Not on file  Lifestyle  . Physical activity:    Days per week: Not on file    Minutes per session: Not on file  . Stress: Not on file  Relationships  . Social connections:    Talks on phone: Not on file    Gets together: Not on file    Attends religious service:  Not on file    Active member of club or organization: Not on file    Attends meetings of clubs or organizations: Not on file    Relationship status: Not on file  . Intimate partner violence:    Fear of current or ex partner: Not on file    Emotionally abused: Not on file    Physically abused: Not on file    Forced sexual activity: Not on file  Other Topics Concern  . Not on file  Social History Narrative   Lives at home with husband (R) and daughter [name])   She is retired.   Has 4 children.    Caffeine use: Large cup of coffee/day and 4 sodas      ROS:  General: Negative for anorexia, weight loss, fever, chills,  fatigue, weakness. Eyes: Negative for vision changes.  ENT: Negative for hoarseness, difficulty swallowing , nasal congestion. CV: Negative for chest pain, angina, palpitations, dyspnea on exertion, peripheral edema.  Respiratory: Negative for dyspnea at rest, dyspnea on exertion, cough, sputum, wheezing.  GI: See history of present illness. GU:  Negative for dysuria, hematuria, urinary incontinence, urinary frequency, nocturnal urination.  MS: Negative for joint pain, low back pain.  Derm: Negative for rash or itching.  Neuro: Negative for weakness, abnormal sensation, seizure, frequent headaches, memory loss, confusion.  Psych: Negative for anxiety, depression, suicidal ideation, hallucinations.  Endo: Negative for unusual weight change.  Heme: Negative for bruising or bleeding. Allergy: Negative for rash or hives.    Physical Examination:  BP 122/76   Pulse 61   Temp (!) 97.1 F (36.2 C) (Oral)   Ht 5\' 4"  (1.626 m)   Wt 210 lb 9.6 oz (95.5 kg)   BMI 36.15 kg/m    General: Well-nourished, well-developed in no acute distress.  Head: Normocephalic, atraumatic.   Eyes: Conjunctiva pink, no icterus. Mouth: Oropharyngeal mucosa moist and pink , no lesions erythema or exudate. Neck: Supple without thyromegaly, masses, or lymphadenopathy.  Lungs: Clear to  auscultation bilaterally.  Heart: Regular rate and rhythm, no murmurs rubs or gallops.  Abdomen: Bowel sounds are normal, nontender, nondistended, no hepatosplenomegaly or masses, no abdominal bruits or    hernia , no rebound or guarding.   Rectal: Skin tag noted externally.  No masses in the rectal vault.  No stool present.  Secretions are heme-negative Extremities: No lower extremity edema. No clubbing or deformities.  Neuro: Alert and oriented x 4 , grossly normal neurologically.  Skin: Warm and dry, no rash or jaundice.   Psych: Alert and cooperative, normal mood and affect.

## 2018-05-23 NOTE — Progress Notes (Signed)
CC'D TO PCP °

## 2018-05-24 ENCOUNTER — Telehealth: Payer: Self-pay | Admitting: Internal Medicine

## 2018-05-24 NOTE — Telephone Encounter (Addendum)
Let patient know that RMR recommends waiting until 07/14/18. Please move her colonoscopy with propofol to 07/14/18.   Complete antibiotics. If her abdominal pain does not get better, she will need CT a/p with contrast.

## 2018-05-24 NOTE — Telephone Encounter (Signed)
[phone]  Please call to reschedule her procedure

## 2018-05-24 NOTE — Telephone Encounter (Signed)
Called and informed pt of advice. She already has TCS prep. Called and informed endo scheduler. TCS w/Propofol w/RMR rescheduled to 07/14/18 at 12:15pm. Pre-op rescheduled to 07/07/18 at 12:45pm. Letter mailed with new procedure instructions.

## 2018-05-24 NOTE — Telephone Encounter (Signed)
Pt called office, she doesn't want to have TCS 06/02/18 as she isn't feeling well. She prefers to wait until 07/14/18 if it's ok.  Routing to LSL for advice.

## 2018-05-25 ENCOUNTER — Ambulatory Visit: Payer: Medicare HMO | Admitting: Gastroenterology

## 2018-05-26 ENCOUNTER — Inpatient Hospital Stay (HOSPITAL_COMMUNITY): Admission: RE | Admit: 2018-05-26 | Payer: Medicare HMO | Source: Ambulatory Visit

## 2018-06-11 ENCOUNTER — Other Ambulatory Visit: Payer: Self-pay | Admitting: Gastroenterology

## 2018-07-07 ENCOUNTER — Encounter (HOSPITAL_COMMUNITY): Payer: Self-pay

## 2018-07-07 ENCOUNTER — Other Ambulatory Visit: Payer: Self-pay

## 2018-07-07 ENCOUNTER — Encounter (HOSPITAL_COMMUNITY)
Admission: RE | Admit: 2018-07-07 | Discharge: 2018-07-07 | Disposition: A | Discharge status: Home / Self Care | Payer: Medicare HMO | Source: Ambulatory Visit | Attending: Internal Medicine | Admitting: Internal Medicine

## 2018-07-12 ENCOUNTER — Telehealth: Payer: Self-pay

## 2018-07-12 NOTE — Telephone Encounter (Signed)
Pt can disregard previous call.

## 2018-07-12 NOTE — Telephone Encounter (Signed)
Tried to call pt to see if she can arrive earlier for TCS 07/14/18, no answer, LMOVM for return call.

## 2018-07-13 ENCOUNTER — Telehealth: Payer: Self-pay | Admitting: *Deleted

## 2018-07-13 NOTE — Telephone Encounter (Signed)
Patient called to cancel procedure that is scheduled for tomorrow. She reports she has not been feeling well and she just does not feel like drinking the prep right now. She reports she will call back to r/s since she just didn't really want to do this right now. Called endo and LMOVM making aware. FYI to LSL.

## 2018-07-13 NOTE — Telephone Encounter (Signed)
NOTED

## 2018-07-14 ENCOUNTER — Ambulatory Visit (HOSPITAL_COMMUNITY): Admission: RE | Admit: 2018-07-14 | Payer: Medicare HMO | Source: Home / Self Care | Admitting: Internal Medicine

## 2018-07-14 ENCOUNTER — Encounter (HOSPITAL_COMMUNITY): Admission: RE | Payer: Self-pay | Source: Home / Self Care

## 2018-07-14 SURGERY — COLONOSCOPY WITH PROPOFOL
Anesthesia: Monitor Anesthesia Care

## 2018-11-14 ENCOUNTER — Other Ambulatory Visit (HOSPITAL_COMMUNITY): Payer: Self-pay | Admitting: Family Medicine

## 2018-11-14 DIAGNOSIS — Z1231 Encounter for screening mammogram for malignant neoplasm of breast: Secondary | ICD-10-CM

## 2018-11-28 ENCOUNTER — Ambulatory Visit (HOSPITAL_COMMUNITY): Payer: Medicare HMO

## 2019-01-22 IMAGING — DX DG CHEST 2V
2 series · 2 of 2 positions shown · non-contrast
Comparison: January 04, 2005

CLINICAL DATA: Shortness of breath with exertion

EXAM:
CHEST  2 VIEW

[dg chest 2 view (1 of 2)]
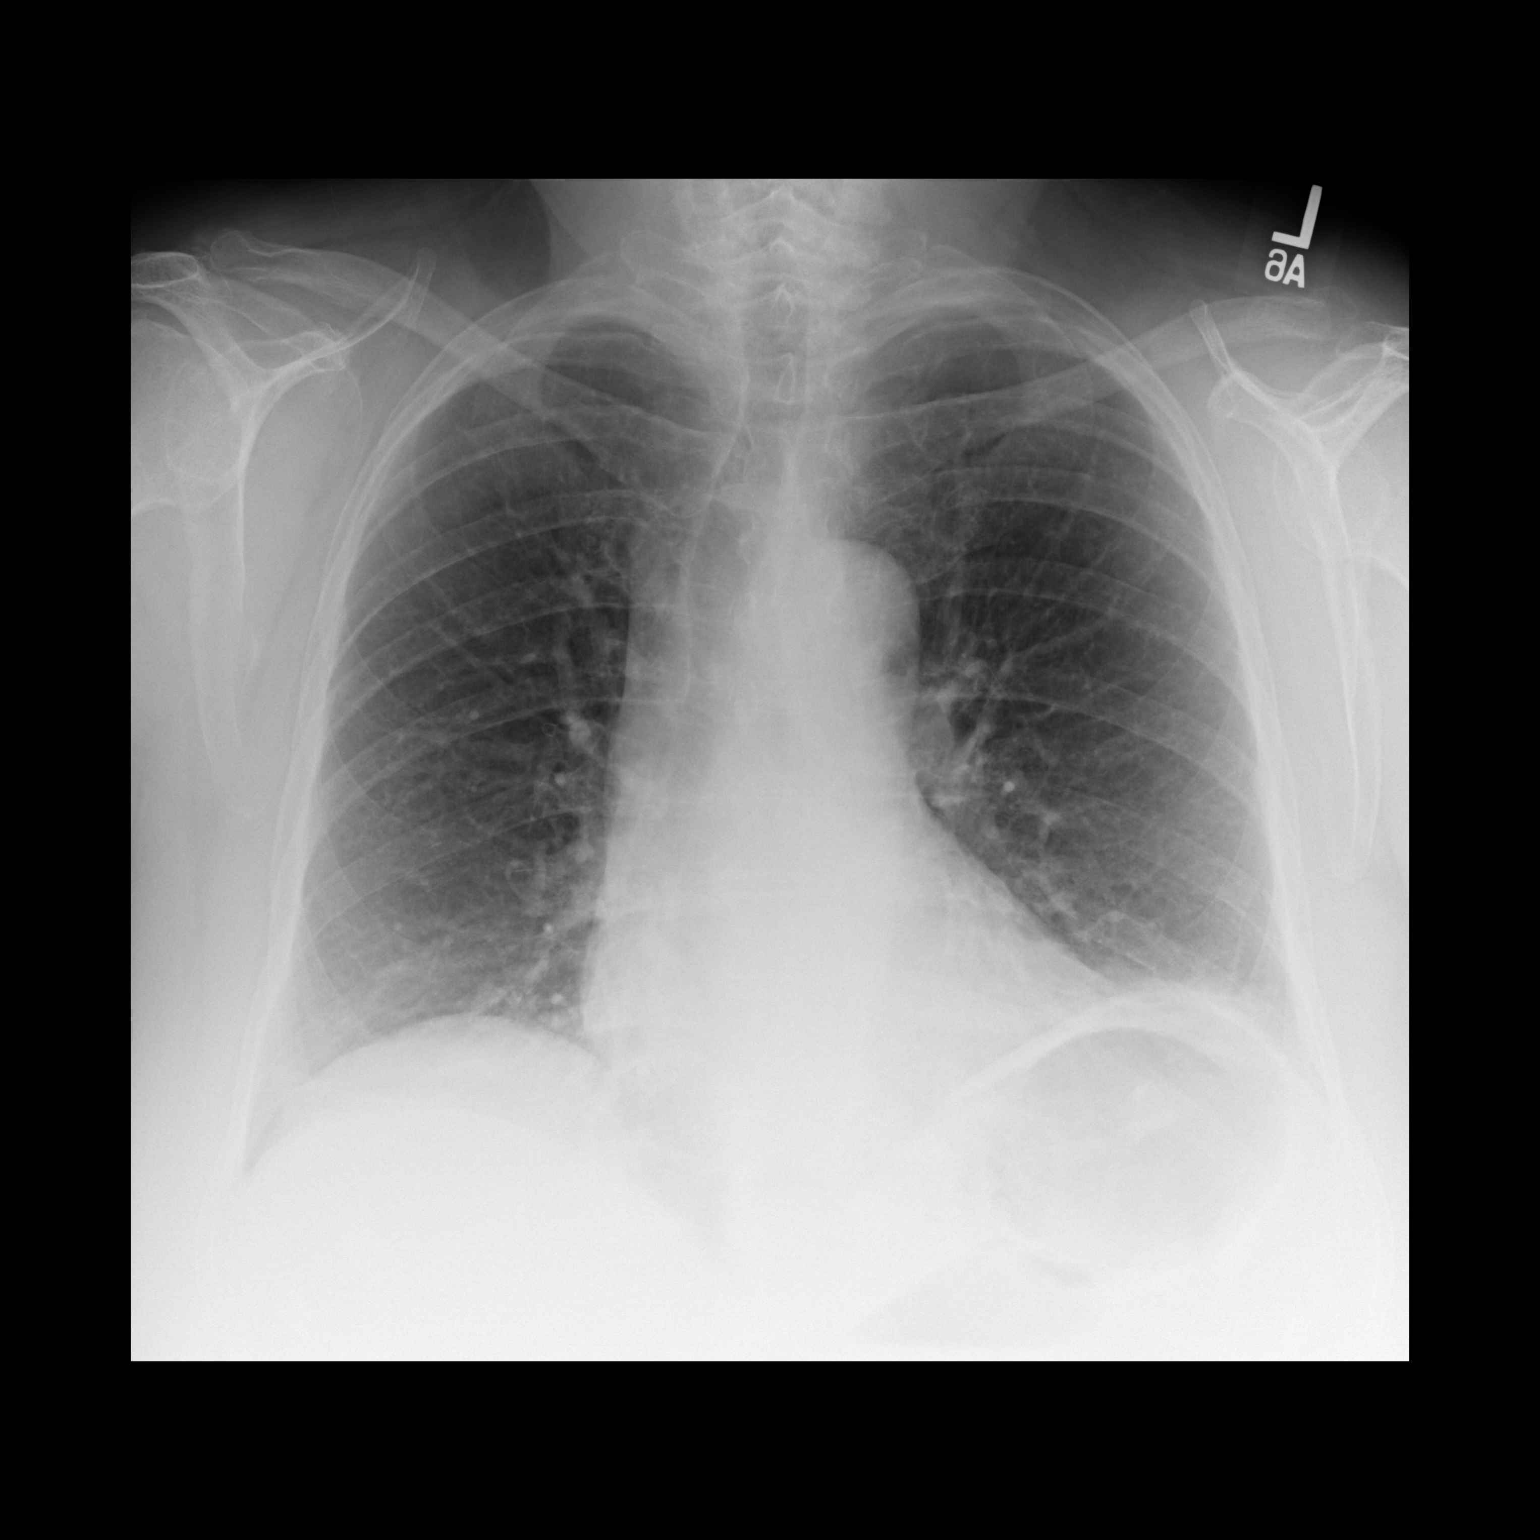

[dg chest 2 view (2 of 2)]
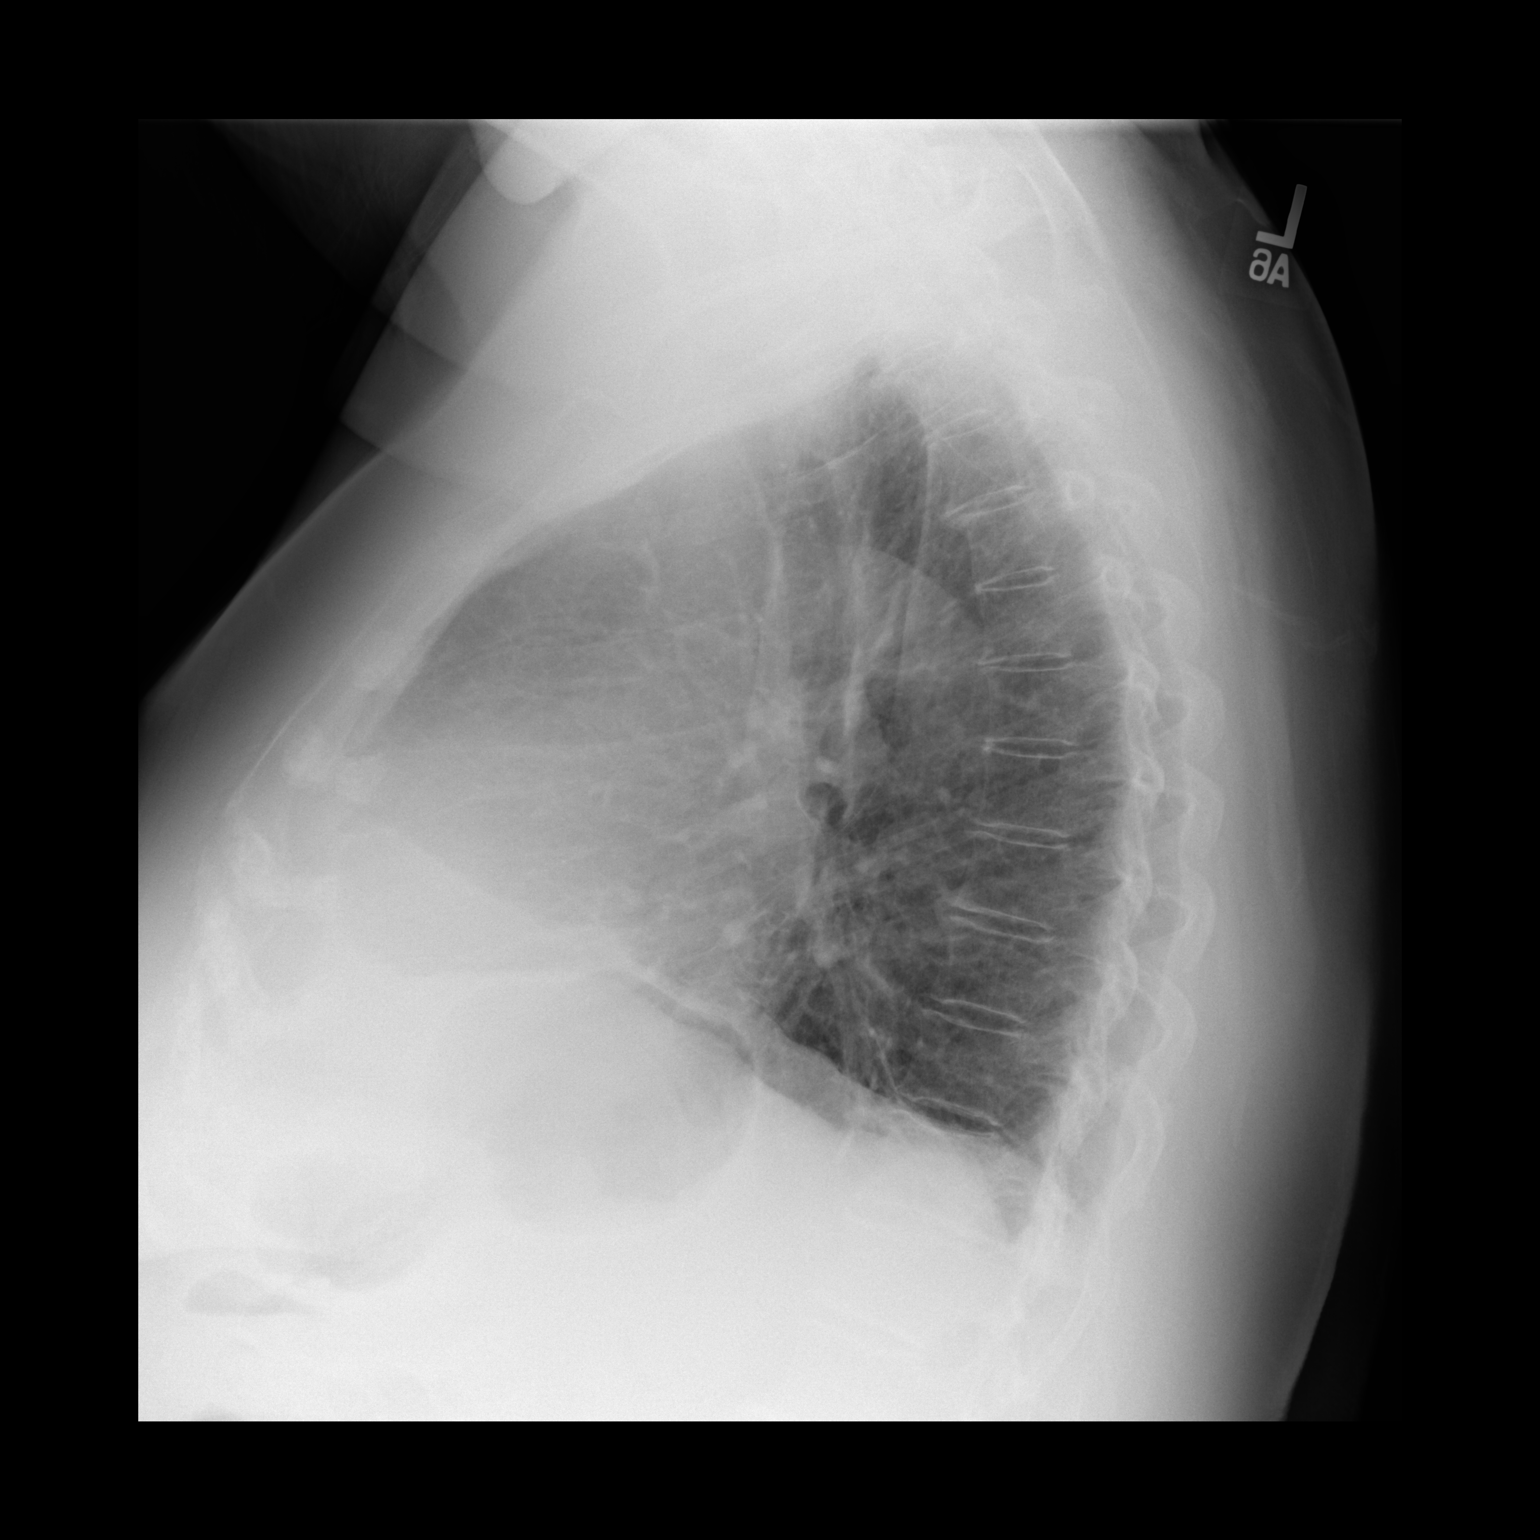

[2 of 2 positions shown; findings below may reference images not displayed]

FINDINGS: There is no appreciable edema or consolidation. There is slight left
base atelectasis. Heart size and pulmonary vascularity are normal.
No adenopathy. No bone lesions.
IMPRESSION: Slight left base atelectasis. No edema or consolidation. Cardiac
silhouette within normal limits.

## 2020-01-12 ENCOUNTER — Other Ambulatory Visit (HOSPITAL_COMMUNITY): Payer: Self-pay | Admitting: Family Medicine

## 2020-01-12 DIAGNOSIS — Z1231 Encounter for screening mammogram for malignant neoplasm of breast: Secondary | ICD-10-CM

## 2020-01-29 ENCOUNTER — Ambulatory Visit (HOSPITAL_COMMUNITY): Payer: Medicare HMO

## 2020-09-18 ENCOUNTER — Other Ambulatory Visit (HOSPITAL_COMMUNITY): Payer: Self-pay | Admitting: Family Medicine

## 2020-09-18 DIAGNOSIS — Z1231 Encounter for screening mammogram for malignant neoplasm of breast: Secondary | ICD-10-CM

## 2020-09-19 ENCOUNTER — Other Ambulatory Visit (HOSPITAL_COMMUNITY): Payer: Self-pay | Admitting: Family Medicine

## 2020-09-19 DIAGNOSIS — N63 Unspecified lump in unspecified breast: Secondary | ICD-10-CM

## 2020-10-22 ENCOUNTER — Ambulatory Visit (HOSPITAL_COMMUNITY)
Admission: RE | Admit: 2020-10-22 | Discharge: 2020-10-22 | Disposition: A | Discharge status: Home / Self Care | Payer: Medicare HMO | Source: Ambulatory Visit | Attending: Family Medicine | Admitting: Family Medicine

## 2020-10-22 ENCOUNTER — Other Ambulatory Visit: Payer: Self-pay

## 2020-10-22 DIAGNOSIS — N63 Unspecified lump in unspecified breast: Secondary | ICD-10-CM

## 2020-10-22 DIAGNOSIS — N6311 Unspecified lump in the right breast, upper outer quadrant: Secondary | ICD-10-CM | POA: Diagnosis present
